# Patient Record
Sex: Male | Born: 1940 | Race: White | Hispanic: No | Marital: Single | Smoking: Never smoker
Health system: Southern US, Community
[De-identification: ages and names within clinical notes are randomized; demographics above are authoritative.]

## PROBLEM LIST (undated history)

## (undated) DIAGNOSIS — M1990 Unspecified osteoarthritis, unspecified site: Secondary | ICD-10-CM

## (undated) DIAGNOSIS — G4730 Sleep apnea, unspecified: Secondary | ICD-10-CM

## (undated) DIAGNOSIS — E119 Type 2 diabetes mellitus without complications: Secondary | ICD-10-CM

## (undated) DIAGNOSIS — I10 Essential (primary) hypertension: Secondary | ICD-10-CM

## (undated) DIAGNOSIS — Z973 Presence of spectacles and contact lenses: Secondary | ICD-10-CM

## (undated) DIAGNOSIS — Z972 Presence of dental prosthetic device (complete) (partial): Secondary | ICD-10-CM

## (undated) HISTORY — PX: PROSTATE SURGERY: SHX751

---

## 1999-06-15 ENCOUNTER — Encounter: Payer: Self-pay | Admitting: Orthopaedic Surgery

## 1999-06-15 ENCOUNTER — Encounter: Admission: RE | Admit: 1999-06-15 | Discharge: 1999-06-15 | Payer: Self-pay | Admitting: Orthopaedic Surgery

## 2005-10-13 ENCOUNTER — Ambulatory Visit (HOSPITAL_BASED_OUTPATIENT_CLINIC_OR_DEPARTMENT_OTHER): Admission: RE | Admit: 2005-10-13 | Discharge: 2005-10-13 | Payer: Self-pay | Admitting: *Deleted

## 2005-10-17 ENCOUNTER — Ambulatory Visit: Payer: Self-pay | Admitting: Internal Medicine

## 2005-11-10 ENCOUNTER — Ambulatory Visit (HOSPITAL_BASED_OUTPATIENT_CLINIC_OR_DEPARTMENT_OTHER): Admission: RE | Admit: 2005-11-10 | Discharge: 2005-11-10 | Payer: Self-pay | Admitting: *Deleted

## 2014-06-18 ENCOUNTER — Emergency Department (HOSPITAL_COMMUNITY): Payer: No Typology Code available for payment source

## 2014-06-18 ENCOUNTER — Emergency Department (HOSPITAL_COMMUNITY)
Admission: EM | Admit: 2014-06-18 | Discharge: 2014-06-19 | Disposition: A | Discharge status: Home / Self Care | Payer: No Typology Code available for payment source | Source: Home / Self Care | Attending: Emergency Medicine | Admitting: Emergency Medicine

## 2014-06-18 ENCOUNTER — Encounter (HOSPITAL_COMMUNITY): Payer: Self-pay | Admitting: Radiology

## 2014-06-18 DIAGNOSIS — S20312A Abrasion of left front wall of thorax, initial encounter: Secondary | ICD-10-CM | POA: Insufficient documentation

## 2014-06-18 DIAGNOSIS — Y92410 Unspecified street and highway as the place of occurrence of the external cause: Secondary | ICD-10-CM

## 2014-06-18 DIAGNOSIS — S60512A Abrasion of left hand, initial encounter: Secondary | ICD-10-CM

## 2014-06-18 DIAGNOSIS — S80812A Abrasion, left lower leg, initial encounter: Secondary | ICD-10-CM | POA: Insufficient documentation

## 2014-06-18 DIAGNOSIS — M25519 Pain in unspecified shoulder: Secondary | ICD-10-CM

## 2014-06-18 DIAGNOSIS — S60511A Abrasion of right hand, initial encounter: Secondary | ICD-10-CM

## 2014-06-18 DIAGNOSIS — S42309A Unspecified fracture of shaft of humerus, unspecified arm, initial encounter for closed fracture: Secondary | ICD-10-CM

## 2014-06-18 DIAGNOSIS — Z880 Allergy status to penicillin: Secondary | ICD-10-CM | POA: Insufficient documentation

## 2014-06-18 DIAGNOSIS — S42291A Other displaced fracture of upper end of right humerus, initial encounter for closed fracture: Secondary | ICD-10-CM | POA: Insufficient documentation

## 2014-06-18 DIAGNOSIS — Z79899 Other long term (current) drug therapy: Secondary | ICD-10-CM

## 2014-06-18 DIAGNOSIS — S80811A Abrasion, right lower leg, initial encounter: Secondary | ICD-10-CM

## 2014-06-18 DIAGNOSIS — Y998 Other external cause status: Secondary | ICD-10-CM | POA: Insufficient documentation

## 2014-06-18 DIAGNOSIS — Y9389 Activity, other specified: Secondary | ICD-10-CM

## 2014-06-18 DIAGNOSIS — R55 Syncope and collapse: Secondary | ICD-10-CM | POA: Diagnosis not present

## 2014-06-18 LAB — CBC WITH DIFFERENTIAL/PLATELET
Basophils Absolute: 0 10*3/uL (ref 0.0–0.1)
Basophils Relative: 0 % (ref 0–1)
EOS PCT: 0 % (ref 0–5)
Eosinophils Absolute: 0.1 10*3/uL (ref 0.0–0.7)
HCT: 36.2 % — ABNORMAL LOW (ref 39.0–52.0)
HEMOGLOBIN: 12.6 g/dL — AB (ref 13.0–17.0)
LYMPHS PCT: 6 % — AB (ref 12–46)
Lymphs Abs: 1.2 10*3/uL (ref 0.7–4.0)
MCH: 31.7 pg (ref 26.0–34.0)
MCHC: 34.8 g/dL (ref 30.0–36.0)
MCV: 91 fL (ref 78.0–100.0)
MONO ABS: 1.5 10*3/uL — AB (ref 0.1–1.0)
Monocytes Relative: 7 % (ref 3–12)
Neutro Abs: 19.1 10*3/uL — ABNORMAL HIGH (ref 1.7–7.7)
Neutrophils Relative %: 87 % — ABNORMAL HIGH (ref 43–77)
Platelets: 218 10*3/uL (ref 150–400)
RBC: 3.98 MIL/uL — ABNORMAL LOW (ref 4.22–5.81)
RDW: 12.1 % (ref 11.5–15.5)
WBC: 21.9 10*3/uL — ABNORMAL HIGH (ref 4.0–10.5)

## 2014-06-18 LAB — BASIC METABOLIC PANEL
Anion gap: 16 — ABNORMAL HIGH (ref 5–15)
BUN: 15 mg/dL (ref 6–23)
CALCIUM: 8.9 mg/dL (ref 8.4–10.5)
CO2: 21 mEq/L (ref 19–32)
Chloride: 101 mEq/L (ref 96–112)
Creatinine, Ser: 1.07 mg/dL (ref 0.50–1.35)
GFR calc Af Amer: 79 mL/min — ABNORMAL LOW (ref >90)
GFR calc non Af Amer: 68 mL/min — ABNORMAL LOW (ref >90)
Glucose, Bld: 155 mg/dL — ABNORMAL HIGH (ref 70–99)
Potassium: 4.1 mEq/L (ref 3.7–5.3)
Sodium: 138 mEq/L (ref 137–147)

## 2014-06-18 MED ORDER — HYDROMORPHONE HCL 1 MG/ML IJ SOLN
1.0000 mg | Freq: Once | INTRAMUSCULAR | Status: AC
  Administered 2014-06-18: 1 mg via INTRAVENOUS
  Filled 2014-06-18: qty 1

## 2014-06-18 NOTE — ED Notes (Signed)
Pt placed on monitor and into gown upon arrival to room. Pt monitored by blood pressure, pulse ox, and 12 lead.  

## 2014-06-18 NOTE — ED Notes (Signed)
Notified [employer]

## 2014-06-18 NOTE — ED Notes (Signed)
Pt remains monitored by blood pressure, pulse ox, and 5 lead. pts family remains at bedside.  

## 2014-06-18 NOTE — ED Notes (Signed)
Transported to radiology 

## 2014-06-18 NOTE — ED Notes (Signed)
Spoke with [employer] representative. No episodes were picked up by loop monitor.

## 2014-06-18 NOTE — ED Notes (Signed)
Pt remains monitored by blood pressure, pulse ox, and 12 lead. pts family remains at bedside.  
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
## 2014-06-18 NOTE — Progress Notes (Signed)
Orthopedic Tech Progress Note Patient Details:  Timothy Drake [birth year] [id number]  Ortho Devices Type of Ortho Device: Sling immobilizer Ortho Device/Splint Location: RUE Ortho Device/Splint Interventions: Ordered, Application   Braulio Bosch 06/18/2014, 10:19 PM

## 2014-06-18 NOTE — ED Notes (Signed)
Ortho tech paged  
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
## 2014-06-19 MED ORDER — HYDROCODONE-ACETAMINOPHEN 5-325 MG PO TABS
1.0000 | ORAL_TABLET | Freq: Once | ORAL | Status: AC
  Administered 2014-06-19: 1 via ORAL
  Filled 2014-06-19: qty 1

## 2014-06-19 MED ORDER — HYDROCODONE-ACETAMINOPHEN 5-325 MG PO TABS: 1.0000 | ORAL_TABLET | Freq: Four times a day (QID) | ORAL | Status: DC | PRN
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
## 2014-06-20 ENCOUNTER — Encounter (HOSPITAL_BASED_OUTPATIENT_CLINIC_OR_DEPARTMENT_OTHER): Payer: Self-pay | Admitting: *Deleted

## 2014-06-20 NOTE — Progress Notes (Signed)
Called dr Oren Binet nurse to get notes from ov tomorrow-and if pt needs telemetry post op

## 2014-06-21 ENCOUNTER — Inpatient Hospital Stay (HOSPITAL_COMMUNITY): Payer: No Typology Code available for payment source

## 2014-06-21 ENCOUNTER — Other Ambulatory Visit: Payer: Self-pay

## 2014-06-21 ENCOUNTER — Encounter (HOSPITAL_COMMUNITY): Payer: Self-pay | Admitting: Internal Medicine

## 2014-06-21 ENCOUNTER — Inpatient Hospital Stay (HOSPITAL_COMMUNITY)
Admission: AD | Admit: 2014-06-21 | Discharge: 2014-06-26 | DRG: 982 | Disposition: A | Discharge status: Home / Self Care | Payer: No Typology Code available for payment source | Source: Ambulatory Visit | Attending: Family Medicine | Admitting: Family Medicine

## 2014-06-21 DIAGNOSIS — F329 Major depressive disorder, single episode, unspecified: Secondary | ICD-10-CM | POA: Diagnosis present

## 2014-06-21 DIAGNOSIS — Z880 Allergy status to penicillin: Secondary | ICD-10-CM | POA: Diagnosis not present

## 2014-06-21 DIAGNOSIS — E119 Type 2 diabetes mellitus without complications: Secondary | ICD-10-CM

## 2014-06-21 DIAGNOSIS — G4733 Obstructive sleep apnea (adult) (pediatric): Secondary | ICD-10-CM | POA: Diagnosis present

## 2014-06-21 DIAGNOSIS — R55 Syncope and collapse: Secondary | ICD-10-CM | POA: Diagnosis present

## 2014-06-21 DIAGNOSIS — S4291XS Fracture of right shoulder girdle, part unspecified, sequela: Secondary | ICD-10-CM

## 2014-06-21 DIAGNOSIS — I10 Essential (primary) hypertension: Secondary | ICD-10-CM | POA: Diagnosis present

## 2014-06-21 DIAGNOSIS — Z9889 Other specified postprocedural states: Secondary | ICD-10-CM

## 2014-06-21 DIAGNOSIS — M1990 Unspecified osteoarthritis, unspecified site: Secondary | ICD-10-CM | POA: Diagnosis present

## 2014-06-21 DIAGNOSIS — Z8546 Personal history of malignant neoplasm of prostate: Secondary | ICD-10-CM | POA: Diagnosis not present

## 2014-06-21 DIAGNOSIS — E780 Pure hypercholesterolemia: Secondary | ICD-10-CM | POA: Diagnosis present

## 2014-06-21 DIAGNOSIS — S4291XA Fracture of right shoulder girdle, part unspecified, initial encounter for closed fracture: Secondary | ICD-10-CM | POA: Diagnosis present

## 2014-06-21 DIAGNOSIS — R569 Unspecified convulsions: Secondary | ICD-10-CM | POA: Insufficient documentation

## 2014-06-21 DIAGNOSIS — S42291A Other displaced fracture of upper end of right humerus, initial encounter for closed fracture: Secondary | ICD-10-CM | POA: Diagnosis present

## 2014-06-21 LAB — COMPREHENSIVE METABOLIC PANEL
ALT: 15 U/L (ref 0–53)
AST: 19 U/L (ref 0–37)
Albumin: 3 g/dL — ABNORMAL LOW (ref 3.5–5.2)
Alkaline Phosphatase: 57 U/L (ref 39–117)
Anion gap: 13 (ref 5–15)
BUN: 18 mg/dL (ref 6–23)
CALCIUM: 8.9 mg/dL (ref 8.4–10.5)
CO2: 25 mEq/L (ref 19–32)
Chloride: 99 mEq/L (ref 96–112)
Creatinine, Ser: 1.01 mg/dL (ref 0.50–1.35)
GFR calc non Af Amer: 73 mL/min — ABNORMAL LOW (ref >90)
GFR, EST AFRICAN AMERICAN: 84 mL/min — AB (ref >90)
Glucose, Bld: 145 mg/dL — ABNORMAL HIGH (ref 70–99)
POTASSIUM: 4.1 meq/L (ref 3.7–5.3)
SODIUM: 137 meq/L (ref 137–147)
TOTAL PROTEIN: 6.1 g/dL (ref 6.0–8.3)
Total Bilirubin: 0.4 mg/dL (ref 0.3–1.2)

## 2014-06-21 LAB — CBC WITH DIFFERENTIAL/PLATELET
BASOS PCT: 0 % (ref 0–1)
Basophils Absolute: 0 10*3/uL (ref 0.0–0.1)
EOS ABS: 0.2 10*3/uL (ref 0.0–0.7)
EOS PCT: 2 % (ref 0–5)
HEMATOCRIT: 29.4 % — AB (ref 39.0–52.0)
HEMOGLOBIN: 10 g/dL — AB (ref 13.0–17.0)
LYMPHS ABS: 1.4 10*3/uL (ref 0.7–4.0)
Lymphocytes Relative: 16 % (ref 12–46)
MCH: 30.9 pg (ref 26.0–34.0)
MCHC: 34 g/dL (ref 30.0–36.0)
MCV: 90.7 fL (ref 78.0–100.0)
MONO ABS: 0.8 10*3/uL (ref 0.1–1.0)
Monocytes Relative: 9 % (ref 3–12)
NEUTROS PCT: 73 % (ref 43–77)
Neutro Abs: 6.4 10*3/uL (ref 1.7–7.7)
Platelets: 205 10*3/uL (ref 150–400)
RBC: 3.24 MIL/uL — AB (ref 4.22–5.81)
RDW: 12.3 % (ref 11.5–15.5)
WBC: 8.9 10*3/uL (ref 4.0–10.5)

## 2014-06-21 LAB — TROPONIN I: Troponin I: <0.3 ng/mL (ref <0.30)

## 2014-06-21 LAB — TSH: TSH: 4.03 u[IU]/mL (ref 0.350–4.500)

## 2014-06-21 LAB — GLUCOSE, CAPILLARY
Glucose-Capillary: 148 mg/dL — ABNORMAL HIGH (ref 70–99)
Glucose-Capillary: 122 mg/dL — ABNORMAL HIGH (ref 70–99)

## 2014-06-21 MED ORDER — DOCUSATE SODIUM 100 MG PO CAPS
100.0000 mg | ORAL_CAPSULE | Freq: Two times a day (BID) | ORAL | Status: DC
  Administered 2014-06-21 – 2014-06-26 (×10): 100 mg via ORAL
  Filled 2014-06-21 (×10): qty 1

## 2014-06-21 MED ORDER — VENLAFAXINE HCL ER 150 MG PO TB24: 150.0000 mg | ORAL_TABLET | Freq: Every morning | ORAL | Status: DC

## 2014-06-21 MED ORDER — ZOLPIDEM TARTRATE 5 MG PO TABS
5.0000 mg | ORAL_TABLET | Freq: Every evening | ORAL | Status: DC | PRN
  Administered 2014-06-21 – 2014-06-25 (×5): 5 mg via ORAL
  Filled 2014-06-21 (×4): qty 1

## 2014-06-21 MED ORDER — SODIUM CHLORIDE 0.9 % IJ SOLN: 3.0000 mL | INTRAMUSCULAR | Status: DC | PRN

## 2014-06-21 MED ORDER — ENOXAPARIN SODIUM 40 MG/0.4ML ~~LOC~~ SOLN
40.0000 mg | SUBCUTANEOUS | Status: DC
  Administered 2014-06-21 – 2014-06-25 (×5): 40 mg via SUBCUTANEOUS
  Filled 2014-06-21 (×6): qty 0.4

## 2014-06-21 MED ORDER — SODIUM CHLORIDE 0.9 % IJ SOLN
3.0000 mL | Freq: Two times a day (BID) | INTRAMUSCULAR | Status: DC
  Administered 2014-06-22 – 2014-06-24 (×5): 3 mL via INTRAVENOUS

## 2014-06-21 MED ORDER — VENLAFAXINE HCL ER 150 MG PO CP24
150.0000 mg | ORAL_CAPSULE | Freq: Every day | ORAL | Status: DC
  Administered 2014-06-22 – 2014-06-26 (×4): 150 mg via ORAL
  Filled 2014-06-21 (×6): qty 1

## 2014-06-21 MED ORDER — SODIUM CHLORIDE 0.9 % IJ SOLN
3.0000 mL | Freq: Two times a day (BID) | INTRAMUSCULAR | Status: DC
  Administered 2014-06-21 – 2014-06-25 (×5): 3 mL via INTRAVENOUS

## 2014-06-21 MED ORDER — ALBUTEROL SULFATE (2.5 MG/3ML) 0.083% IN NEBU: 2.5000 mg | INHALATION_SOLUTION | RESPIRATORY_TRACT | Status: DC | PRN

## 2014-06-21 MED ORDER — PRAVASTATIN SODIUM 80 MG PO TABS
80.0000 mg | ORAL_TABLET | Freq: Every day | ORAL | Status: DC
  Administered 2014-06-21 – 2014-06-25 (×5): 80 mg via ORAL
  Filled 2014-06-21 (×6): qty 1

## 2014-06-21 MED ORDER — INSULIN ASPART 100 UNIT/ML ~~LOC~~ SOLN
0.0000 [IU] | Freq: Three times a day (TID) | SUBCUTANEOUS | Status: DC
  Administered 2014-06-21 – 2014-06-22 (×2): 2 [IU] via SUBCUTANEOUS
  Administered 2014-06-22 – 2014-06-23 (×3): 3 [IU] via SUBCUTANEOUS
  Administered 2014-06-23: 2 [IU] via SUBCUTANEOUS
  Administered 2014-06-24: 3 [IU] via SUBCUTANEOUS
  Administered 2014-06-24: 2 [IU] via SUBCUTANEOUS
  Administered 2014-06-25: 5 [IU] via SUBCUTANEOUS
  Administered 2014-06-26: 2 [IU] via SUBCUTANEOUS
  Administered 2014-06-26: 5 [IU] via SUBCUTANEOUS

## 2014-06-21 MED ORDER — ONDANSETRON HCL 4 MG PO TABS: 4.0000 mg | ORAL_TABLET | Freq: Four times a day (QID) | ORAL | Status: DC | PRN

## 2014-06-21 MED ORDER — HYDROCODONE-ACETAMINOPHEN 5-325 MG PO TABS
1.0000 | ORAL_TABLET | Freq: Four times a day (QID) | ORAL | Status: DC | PRN
Start: 2014-06-21 — End: 2014-06-26
  Administered 2014-06-21 – 2014-06-26 (×9): 1 via ORAL
  Filled 2014-06-21 (×9): qty 1

## 2014-06-21 MED ORDER — ACETAMINOPHEN 650 MG RE SUPP: 650.0000 mg | Freq: Four times a day (QID) | RECTAL | Status: DC | PRN

## 2014-06-21 MED ORDER — AMLODIPINE BESYLATE 5 MG PO TABS
5.0000 mg | ORAL_TABLET | Freq: Every day | ORAL | Status: DC
  Administered 2014-06-22 – 2014-06-26 (×4): 5 mg via ORAL
  Filled 2014-06-21 (×5): qty 1

## 2014-06-21 MED ORDER — IRBESARTAN 75 MG PO TABS
75.0000 mg | ORAL_TABLET | Freq: Every day | ORAL | Status: DC
  Administered 2014-06-22 – 2014-06-26 (×4): 75 mg via ORAL
  Filled 2014-06-21 (×5): qty 1

## 2014-06-21 MED ORDER — ACETAMINOPHEN 325 MG PO TABS
650.0000 mg | ORAL_TABLET | Freq: Four times a day (QID) | ORAL | Status: DC | PRN
Start: 2014-06-21 — End: 2014-06-26
  Administered 2014-06-22: 650 mg via ORAL
  Filled 2014-06-21 (×2): qty 2

## 2014-06-21 MED ORDER — MORPHINE SULFATE 2 MG/ML IJ SOLN
1.0000 mg | INTRAMUSCULAR | Status: DC | PRN
  Administered 2014-06-22 – 2014-06-26 (×4): 1 mg via INTRAVENOUS
  Filled 2014-06-21 (×4): qty 1

## 2014-06-21 MED ORDER — ONDANSETRON HCL 4 MG/2ML IJ SOLN
4.0000 mg | Freq: Four times a day (QID) | INTRAMUSCULAR | Status: DC | PRN
  Administered 2014-06-22: 4 mg via INTRAVENOUS
  Filled 2014-06-21: qty 2

## 2014-06-21 MED ORDER — SODIUM CHLORIDE 0.9 % IV SOLN
250.0000 mL | INTRAVENOUS | Status: DC | PRN
Start: 2014-06-21 — End: 2014-06-26
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
## 2014-06-21 NOTE — Progress Notes (Signed)
Patient has home CPAP machine at bedside.  Patient is familiar with equipment and procedure and will self-administer CPAP at bedtime.  Patient was encouraged to notify RN and Respiratory with any issues or concerns.  Humidifier filled.
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
## 2014-06-22 DIAGNOSIS — I369 Nonrheumatic tricuspid valve disorder, unspecified: Secondary | ICD-10-CM

## 2014-06-22 DIAGNOSIS — S4291XS Fracture of right shoulder girdle, part unspecified, sequela: Secondary | ICD-10-CM

## 2014-06-22 DIAGNOSIS — R55 Syncope and collapse: Secondary | ICD-10-CM

## 2014-06-22 LAB — COMPREHENSIVE METABOLIC PANEL
ALBUMIN: 3 g/dL — AB (ref 3.5–5.2)
ALT: 15 U/L (ref 0–53)
AST: 17 U/L (ref 0–37)
Alkaline Phosphatase: 60 U/L (ref 39–117)
Anion gap: 14 (ref 5–15)
BILIRUBIN TOTAL: 0.5 mg/dL (ref 0.3–1.2)
BUN: 18 mg/dL (ref 6–23)
CHLORIDE: 100 meq/L (ref 96–112)
CO2: 25 mEq/L (ref 19–32)
CREATININE: 0.93 mg/dL (ref 0.50–1.35)
Calcium: 8.8 mg/dL (ref 8.4–10.5)
GFR calc non Af Amer: 83 mL/min — ABNORMAL LOW (ref >90)
Glucose, Bld: 136 mg/dL — ABNORMAL HIGH (ref 70–99)
Potassium: 4 mEq/L (ref 3.7–5.3)
Sodium: 139 mEq/L (ref 137–147)
TOTAL PROTEIN: 6.1 g/dL (ref 6.0–8.3)

## 2014-06-22 LAB — GLUCOSE, CAPILLARY
GLUCOSE-CAPILLARY: 145 mg/dL — AB (ref 70–99)
GLUCOSE-CAPILLARY: 153 mg/dL — AB (ref 70–99)
Glucose-Capillary: 167 mg/dL — ABNORMAL HIGH (ref 70–99)

## 2014-06-22 LAB — CBC
HEMATOCRIT: 31.7 % — AB (ref 39.0–52.0)
Hemoglobin: 11 g/dL — ABNORMAL LOW (ref 13.0–17.0)
MCH: 32.2 pg (ref 26.0–34.0)
MCHC: 34.7 g/dL (ref 30.0–36.0)
MCV: 92.7 fL (ref 78.0–100.0)
Platelets: 197 10*3/uL (ref 150–400)
RBC: 3.42 MIL/uL — ABNORMAL LOW (ref 4.22–5.81)
RDW: 12.5 % (ref 11.5–15.5)
WBC: 8.4 10*3/uL (ref 4.0–10.5)

## 2014-06-22 LAB — HEMOGLOBIN A1C
Hgb A1c MFr Bld: 6.9 % — ABNORMAL HIGH (ref <5.7)
MEAN PLASMA GLUCOSE: 151 mg/dL — AB (ref <117)

## 2014-06-22 NOTE — Progress Notes (Signed)
  Echocardiogram 2D Echocardiogram has been performed.  Timothy Drake 06/22/2014, 9:39 AM

## 2014-06-22 NOTE — Progress Notes (Signed)
VASCULAR LAB PRELIMINARY  PRELIMINARY  PRELIMINARY  PRELIMINARY  Carotid Dopplers completed.    Preliminary report:  1-39% ICA stenosis.  Right vertebral artery flow is antegrade.  Left vertebral artery flow is abnormal.   Halcyon Heck, RVT 06/22/2014, 4:42 PM

## 2014-06-22 NOTE — Evaluation (Signed)
Physical Therapy Evaluation Patient Details Name: Timothy Drake MRN: [id number] DOB: July 06, 1941 Today's Date: 06/22/2014   History of Present Illness  73 y.o. male with a past medical history of hypertension, type 2 diabetes, hypercholesterolemia, who has had the syncopal episodes on and off for the last 3 years. He's had about 8-9 episodes in total.  Pt adm after fall resulting in Rt humeral head fx. planned for Rt TSA on 06/25/14.  Clinical Impression  Pt adm due to above. Presents with decreased independence with functional mobility secondary to deficits indicated below (see PT problem list). Pt with multiple syncopal episodes. Pt does not have caregiver support at home and will benefit from SNF for post acute rehab (pt plans [facility] [place]). Pt to benefit from skilled acute PT in acute care to maximize functional PT until D/C.     Follow Up Recommendations SNF    Equipment Recommendations  Cane    Recommendations for Other Services OT consult     Precautions / Restrictions Precautions Precautions: Shoulder;Fall Type of Shoulder Precautions: sling for comfort till surgery Shoulder Interventions: For comfort Required Braces or Orthoses: Sling Restrictions Weight Bearing Restrictions:  (not clarified; assumed NWB at this time)      Mobility  Bed Mobility Overal bed mobility: Needs Assistance Bed Mobility: Rolling;Sidelying to Sit;Sit to Sidelying Rolling: Supervision Sidelying to sit: Mod assist;HOB elevated     Sit to sidelying: Min assist General bed mobility comments: cues for log rolling technique; (A) to perform due to NWB status and pain in Rt UE  Transfers Overall transfer level: Needs assistance Equipment used: 1 person hand held assist Transfers: Sit to/from Stand Sit to Stand: Min guard         General transfer comment: min guard to steady  Ambulation/Gait Ambulation/Gait assistance: Min guard Ambulation Distance (Feet): 100  Feet Assistive device: 1 person hand held assist;None (support through gt belt PRN) Gait Pattern/deviations: Step-through pattern;Decreased stride length Gait velocity: decreased Gait velocity interpretation: Below normal speed for age/gender General Gait Details: challenged with high level balance activities; pt slighty unsteady; Handheld(A) or support of body weight through gt belt as needed  Stairs            Wheelchair Mobility    Modified Rankin (Stroke Patients Only)       Balance Overall balance assessment: Needs assistance;History of Falls Sitting-balance support: Feet supported;No upper extremity supported Sitting balance-Leahy Scale: Good Sitting balance - Comments: denied any dizziness   Standing balance support: During functional activity;Single extremity supported;No upper extremity supported Standing balance-Leahy Scale: Fair Standing balance comment: stood short amount of time without UE support; slight sway             High level balance activites: Head turns;Sudden stops;Direction changes;Backward walking High Level Balance Comments: slighty unsteady             Pertinent Vitals/Pain Pain Assessment: 0-10 Pain Score: 6  Pain Location: Rt shoulder Pain Descriptors / Indicators:  ("sickening") Pain Intervention(s): Monitored during session;Patient requesting pain meds-RN notified;Repositioned    Home Living Family/patient expects to be discharged to:: Skilled nursing facility Living Arrangements: Other relatives             Home Equipment: None Additional Comments: 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; no family to support him at home; plans [facility]    Prior Function Level of Independence: Independent  Hand Dominance   Dominant Hand: Right    Extremity/Trunk Assessment   Upper Extremity Assessment: Defer to OT evaluation (Rt UE in sling)           Lower Extremity Assessment: Overall WFL  for tasks assessed      Cervical / Trunk Assessment: Normal  Communication   Communication: No difficulties  Cognition Arousal/Alertness: Awake/alert Behavior During Therapy: WFL for tasks assessed/performed Overall Cognitive Status: Within Functional Limits for tasks assessed                      General Comments      Exercises        Assessment/Plan    PT Assessment Patient needs continued PT services  PT Diagnosis Abnormality of gait;Acute pain   PT Problem List Decreased activity tolerance;Decreased balance;Decreased mobility;Decreased knowledge of use of DME;Decreased safety awareness;Pain  PT Treatment Interventions DME instruction;Gait training;Stair training;Functional mobility training;Therapeutic activities;Therapeutic exercise;Balance training;Neuromuscular re-education;Patient/family education   PT Goals (Current goals can be found in the Care Plan section) Acute Rehab PT Goals Patient Stated Goal: to go to rehab then get back to independent lifestyle PT Goal Formulation: With patient Time For Goal Achievement: 06/29/14 Potential to Achieve Goals: Good    Frequency Min 4X/week   Barriers to discharge Decreased caregiver support lives with aunt, he is her caregiver    Co-evaluation               End of Session Equipment Utilized During Treatment: Gait belt;Other (comment) (sling) Activity Tolerance: Patient tolerated treatment well Patient left: in bed;with call bell/phone within reach Nurse Communication: Patient requests pain meds;Mobility status         Time: 1440-1500 PT Time Calculation (min) (ACUTE ONLY): 20 min   Charges:   PT Evaluation $Initial PT Evaluation Tier I: 1 Procedure PT Treatments $Gait Training: 8-22 mins   PT G CodesGustavus Bryant, [place]  [phone] 06/22/2014, 3:35 PM

## 2014-06-22 NOTE — Plan of Care (Signed)
Problem: Consults Goal: Diagnosis - Shoulder Surgery Outcome: Completed/Met Date Met:  06/22/14 Total Shoulder Arthroplasty

## 2014-06-22 NOTE — Progress Notes (Signed)
[facility] PROGRESS NOTE  Timothy Drake [id number]:[id number] DOB: [birth year] DOA: 06/21/2014 PCP: Angelina Sheriff., MD  Assessment/Plan: Principal Problem:   Syncope - Etiology uncertain - Pt on telemetry monitoring: no red flags reported - Pt was evaluated by his cardiologist according to EMR prior to admission. - Will assess orthostatic vital signs - Echocardiogram results pending - obtain carotid dopplers - Pt should consider sleep study if not already done and if all other work up negative.  Active Problems:   Shoulder fracture, right - Ortho on board and patient will most likely transition to a SNF on discharge.    DM type 2 (diabetes mellitus, type 2) - Pt on carb modified diet - SSI    Essential hypertension - stable on amlodipine    OSA (obstructive sleep apnea) - Pt to continue home cpap regimen or supplemental oxygen via [place] at night.  Code Status: full Family Communication: no family at bedside  Disposition Plan: Discharge    Consultants:  Ortho  Procedures:  none  Antibiotics:  None  HPI/Subjective: Pt has no new complaints. No acute issues overnight.  Objective: Filed Vitals:   06/22/14 0556  BP: 147/66  Pulse: 56  Temp: 98.5 F (36.9 C)  Resp: 16    Intake/Output Summary (Last 24 hours) at 06/22/14 1052 Last data filed at 06/22/14 0459  Gross per 24 hour  Intake    300 ml  Output    450 ml  Net   -150 ml   Filed Weights   06/21/14 1415  Weight: 83.915 kg (185 lb)    Exam:   General:  Pt in nad, alert and awake  Cardiovascular: rrr, no mrg  Respiratory: cta bl, no wheezes  Abdomen: soft, NT, ND  Musculoskeletal:  right arm in sling   Data Reviewed: Basic Metabolic Panel:  Recent Labs Lab 06/18/14 2003 06/21/14 1642 06/22/14 0738  NA 138 137 139  K 4.1 4.1 4.0  CL 101 99 100  CO2 21 25 25   GLUCOSE 155* 145* 136*  BUN 15 18 18   CREATININE 1.07 1.01 0.93  CALCIUM 8.9 8.9 8.8   Liver Function  Tests:  Recent Labs Lab 06/21/14 1642 06/22/14 0738  AST 19 17  ALT 15 15  ALKPHOS 57 60  BILITOT 0.4 0.5  PROT 6.1 6.1  ALBUMIN 3.0* 3.0*   No results for input(s): LIPASE, AMYLASE in the last 168 hours. No results for input(s): AMMONIA in the last 168 hours. CBC:  Recent Labs Lab 06/18/14 2003 06/21/14 1642 06/22/14 0738  WBC 21.9* 8.9 PENDING  NEUTROABS 19.1* 6.4  --   HGB 12.6* 10.0* 11.0*  HCT 36.2* 29.4* 31.7*  MCV 91.0 90.7 92.7  PLT 218 205 PENDING   Cardiac Enzymes:  Recent Labs Lab 06/21/14 1642  TROPONINI <0.30   BNP (last 3 results) No results for input(s): PROBNP in the last 8760 hours. CBG:  Recent Labs Lab 06/21/14 1616 06/21/14 2113 06/22/14 0645  GLUCAP 148* 122* 145*    No results found for this or any previous visit (from the past 240 hour(s)).   Studies: Dg Chest 1 View  06/22/2014   CLINICAL DATA:  Syncope  EXAM: CHEST - 1 VIEW  COMPARISON:  CT chest dated 06/20/2014  FINDINGS: Lungs are essentially clear. No focal consolidation. No pleural effusion or pneumothorax.  Cardiomegaly.  Holter monitor.  IMPRESSION: No evidence of acute cardiopulmonary disease.   Electronically Signed   By: Julian Hy M.D.   On: 06/22/2014  02:59    Scheduled Meds: . amLODipine  5 mg Oral Daily  . docusate sodium  100 mg Oral BID  . enoxaparin (LOVENOX) injection  40 mg Subcutaneous Q24H  . insulin aspart  0-15 Units Subcutaneous TID WC  . irbesartan  75 mg Oral Daily  . pravastatin  80 mg Oral QHS  . sodium chloride  3 mL Intravenous Q12H  . sodium chloride  3 mL Intravenous Q12H  . venlafaxine XR  150 mg Oral Q breakfast   Continuous Infusions:    Time spent: > 35 minutes    [name]  [facility] Pager [phone] If 7PM-7AM, please contact night-coverage at [email or web address], password [facility] 06/22/2014, 10:52 AM  LOS: 1 day

## 2014-06-23 LAB — GLUCOSE, CAPILLARY
GLUCOSE-CAPILLARY: 131 mg/dL — AB (ref 70–99)
GLUCOSE-CAPILLARY: 175 mg/dL — AB (ref 70–99)
Glucose-Capillary: 130 mg/dL — ABNORMAL HIGH (ref 70–99)
Glucose-Capillary: 144 mg/dL — ABNORMAL HIGH (ref 70–99)

## 2014-06-23 NOTE — Progress Notes (Signed)
[facility] PROGRESS NOTE  Timothy Drake SEG:[id number] DOB: 1940-11-30 DOA: 06/21/2014 PCP: Angelina Sheriff., MD  Assessment/Plan: Principal Problem:   Syncope - Etiology uncertain, although preliminary results show an abnormal Left vertebral artery flow and orthostatic vital signs going from laying to sitting were positive.  - Pt on telemetry monitoring: no red flags reported - Pt was evaluated by his cardiologist according to EMR prior to admission. - Continue to assess orthostatic vital signs - Echocardiogram results pending - Carotid dopplers pending - Pt should consider sleep study if not already done and if all other work up negative.  Active Problems:   Shoulder fracture, right - Ortho on board and patient will most likely transition to a SNF on discharge.    DM type 2 (diabetes mellitus, type 2) - Pt on carb modified diet - SSI    Essential hypertension - stable on amlodipine    OSA (obstructive sleep apnea) - Pt to continue home cpap regimen or supplemental oxygen via [place] at night.  Code Status: full Family Communication: no family at bedside  Disposition Plan: Pending further work up   Consultants:  Ortho  Procedures:  none  Antibiotics:  None  HPI/Subjective: Pt has no new complaints. No acute issues overnight.  Objective: Filed Vitals:   06/23/14 0615  BP: 138/68  Pulse: 71  Temp: 98.3 F (36.8 C)  Resp: 16    Intake/Output Summary (Last 24 hours) at 06/23/14 1111 Last data filed at 06/22/14 1600  Gross per 24 hour  Intake    480 ml  Output      0 ml  Net    480 ml   Filed Weights   06/21/14 1415  Weight: 83.915 kg (185 lb)    Exam:   General:  Pt in nad, alert and awake  Cardiovascular: rrr, no mrg  Respiratory: cta bl, no wheezes  Abdomen: soft, NT, ND  Musculoskeletal:  right arm in sling   Data Reviewed: Basic Metabolic Panel:  Recent Labs Lab 06/18/14 2003 06/21/14 1642 06/22/14 0738  NA 138 137 139   K 4.1 4.1 4.0  CL 101 99 100  CO2 21 25 25   GLUCOSE 155* 145* 136*  BUN 15 18 18   CREATININE 1.07 1.01 0.93  CALCIUM 8.9 8.9 8.8   Liver Function Tests:  Recent Labs Lab 06/21/14 1642 06/22/14 0738  AST 19 17  ALT 15 15  ALKPHOS 57 60  BILITOT 0.4 0.5  PROT 6.1 6.1  ALBUMIN 3.0* 3.0*   No results for input(s): LIPASE, AMYLASE in the last 168 hours. No results for input(s): AMMONIA in the last 168 hours. CBC:  Recent Labs Lab 06/18/14 2003 06/21/14 1642 06/22/14 0738  WBC 21.9* 8.9 8.4  NEUTROABS 19.1* 6.4  --   HGB 12.6* 10.0* 11.0*  HCT 36.2* 29.4* 31.7*  MCV 91.0 90.7 92.7  PLT 218 205 197   Cardiac Enzymes:  Recent Labs Lab 06/21/14 1642  TROPONINI <0.30   BNP (last 3 results) No results for input(s): PROBNP in the last 8760 hours. CBG:  Recent Labs Lab 06/21/14 2113 06/22/14 0645 06/22/14 1129 06/22/14 1632 06/23/14 0713  GLUCAP 122* 145* 153* 167* 130*    No results found for this or any previous visit (from the past 240 hour(s)).   Studies: Dg Chest 1 View  06/22/2014   CLINICAL DATA:  Syncope  EXAM: CHEST - 1 VIEW  COMPARISON:  CT chest dated 06/20/2014  FINDINGS: Lungs are essentially clear. No focal  consolidation. No pleural effusion or pneumothorax.  Cardiomegaly.  Holter monitor.  IMPRESSION: No evidence of acute cardiopulmonary disease.   Electronically Signed   By: Julian Hy M.D.   On: 06/22/2014 02:59    Scheduled Meds: . amLODipine  5 mg Oral Daily  . docusate sodium  100 mg Oral BID  . enoxaparin (LOVENOX) injection  40 mg Subcutaneous Q24H  . insulin aspart  0-15 Units Subcutaneous TID WC  . irbesartan  75 mg Oral Daily  . pravastatin  80 mg Oral QHS  . sodium chloride  3 mL Intravenous Q12H  . sodium chloride  3 mL Intravenous Q12H  . venlafaxine XR  150 mg Oral Q breakfast   Continuous Infusions:    Time spent: > 35 minutes    [name]  [facility] Pager [phone] If 7PM-7AM, please contact  night-coverage at [email or web address], password [facility] 06/23/2014, 11:11 AM  LOS: 2 days

## 2014-06-24 ENCOUNTER — Inpatient Hospital Stay (HOSPITAL_COMMUNITY): Payer: No Typology Code available for payment source

## 2014-06-24 LAB — GLUCOSE, CAPILLARY
GLUCOSE-CAPILLARY: 132 mg/dL — AB (ref 70–99)
GLUCOSE-CAPILLARY: 175 mg/dL — AB (ref 70–99)
Glucose-Capillary: 167 mg/dL — ABNORMAL HIGH (ref 70–99)
Glucose-Capillary: 150 mg/dL — ABNORMAL HIGH (ref 70–99)

## 2014-06-24 MED ORDER — VANCOMYCIN HCL IN DEXTROSE 1-5 GM/200ML-% IV SOLN
1000.0000 mg | INTRAVENOUS | Status: AC
  Administered 2014-06-25: 1000 mg via INTRAVENOUS
  Filled 2014-06-24: qty 200

## 2014-06-24 NOTE — Progress Notes (Signed)
EEG Completed; Results Pending  

## 2014-06-24 NOTE — Progress Notes (Signed)
[facility] PROGRESS NOTE  WIRT HEMMERICH BOF:[id number] DOB: [birth year] DOA: 06/21/2014 PCP: Angelina Sheriff., MD  Assessment/Plan: Principal Problem:   Syncope - Etiology uncertain, although preliminary results show an abnormal Left vertebral artery flow and orthostatic vital signs going from laying to sitting were positive on last check. None other recorded despite order. Will reorder  - Pt on telemetry monitoring: no red flags reported - Pt was evaluated by his cardiologist according to EMR prior to admission. - Continue to assess orthostatic vital signs - Echocardiogram results pending - Carotid dopplers pending but preliminary results discussed above. Awaiting official report. - Pt should consider sleep study if not already done and if all other work up negative. - Obtain neuro consult and place order for EEG  Active Problems:   Shoulder fracture, right - Ortho on board and patient will most likely transition to a SNF on discharge. - Procedure most likely on 06/25/14    DM type 2 (diabetes mellitus, type 2) - Pt on carb modified diet - SSI    Essential hypertension - stable on amlodipine    OSA (obstructive sleep apnea) - Pt to continue home cpap regimen or supplemental oxygen via [place] at night.  Code Status: full Family Communication: no family at bedside  Disposition Plan: Pending further work up   Consultants:  Ortho  Procedures:  none  Antibiotics:  None  HPI/Subjective: Pt has no new complaints. States he is "depressed" about not knowing why he is passing out  Objective: Filed Vitals:   06/24/14 0615  BP: 143/55  Pulse: 70  Temp: 97.6 F (36.4 C)  Resp: 18    Intake/Output Summary (Last 24 hours) at 06/24/14 1100 Last data filed at 06/24/14 0800  Gross per 24 hour  Intake    240 ml  Output      0 ml  Net    240 ml   Filed Weights   06/21/14 1415  Weight: 83.915 kg (185 lb)    Exam:   General:  Pt in nad, alert and  awake  Cardiovascular: rrr, no mrg  Respiratory: cta bl, no wheezes  Abdomen: soft, NT, ND  Musculoskeletal:  right arm in sling   Data Reviewed: Basic Metabolic Panel:  Recent Labs Lab 06/18/14 2003 06/21/14 1642 06/22/14 0738  NA 138 137 139  K 4.1 4.1 4.0  CL 101 99 100  CO2 21 25 25   GLUCOSE 155* 145* 136*  BUN 15 18 18   CREATININE 1.07 1.01 0.93  CALCIUM 8.9 8.9 8.8   Liver Function Tests:  Recent Labs Lab 06/21/14 1642 06/22/14 0738  AST 19 17  ALT 15 15  ALKPHOS 57 60  BILITOT 0.4 0.5  PROT 6.1 6.1  ALBUMIN 3.0* 3.0*   No results for input(s): LIPASE, AMYLASE in the last 168 hours. No results for input(s): AMMONIA in the last 168 hours. CBC:  Recent Labs Lab 06/18/14 2003 06/21/14 1642 06/22/14 0738  WBC 21.9* 8.9 8.4  NEUTROABS 19.1* 6.4  --   HGB 12.6* 10.0* 11.0*  HCT 36.2* 29.4* 31.7*  MCV 91.0 90.7 92.7  PLT 218 205 197   Cardiac Enzymes:  Recent Labs Lab 06/21/14 1642  TROPONINI <0.30   BNP (last 3 results) No results for input(s): PROBNP in the last 8760 hours. CBG:  Recent Labs Lab 06/23/14 0713 06/23/14 1234 06/23/14 1637 06/23/14 2140 06/24/14 0621  GLUCAP 130* 131* 175* 144* 132*    No results found for this or any previous visit (  from the past 240 hour(s)).   Studies: No results found.  Scheduled Meds: . amLODipine  5 mg Oral Daily  . docusate sodium  100 mg Oral BID  . enoxaparin (LOVENOX) injection  40 mg Subcutaneous Q24H  . insulin aspart  0-15 Units Subcutaneous TID WC  . irbesartan  75 mg Oral Daily  . pravastatin  80 mg Oral QHS  . sodium chloride  3 mL Intravenous Q12H  . sodium chloride  3 mL Intravenous Q12H  . venlafaxine XR  150 mg Oral Q breakfast   Continuous Infusions:    Time spent: > 35 minutes    [name]  [facility] Pager [phone] If 7PM-7AM, please contact night-coverage at [email or web address], password [facility] 06/24/2014, 11:00 AM  LOS: 3 days

## 2014-06-24 NOTE — Progress Notes (Signed)
Physical Therapy Treatment Patient Details Name: Timothy Drake MRN: [id number] DOB: Dec 01, 1940 Today's Date: 06/24/2014    History of Present Illness 73 y.o. male with a past medical history of hypertension, type 2 diabetes, hypercholesterolemia, who has had the syncopal episodes on and off for the last 3 years. He's had about 8-9 episodes in total.  Pt adm after fall resulting in Rt humeral head fx. planned for Rt hemiarthroplasty on 06/25/14.    PT Comments    Making progress with mobility and activity tolerance; Orthostatic vitals wer inconclusive, though did not HR increase of 30 bpm with the transition to standing, pt asymptomatic  See other note of this date for detailed Orthostatic Vitals   Follow Up Recommendations  SNF     Equipment Recommendations  Cane    Recommendations for Other Services       Precautions / Restrictions Precautions Precautions: Shoulder;Fall Type of Shoulder Precautions: Dr [name] Shoulder Interventions: Shoulder sling/immobilizer;At all times;Off for dressing/bathing/exercises Precaution Booklet Issued: Yes (comment) Required Braces or Orthoses: Sling Restrictions Weight Bearing Restrictions: Yes RUE Weight Bearing: Non weight bearing    Mobility  Bed Mobility Overal bed mobility: Needs Assistance Bed Mobility: Rolling;Sidelying to Sit Rolling: Supervision Sidelying to sit: Mod assist;HOB elevated       General bed mobility comments: cues for log rolling technique; (A) to perform due to NWB status and pain in Rt UE  Transfers Overall transfer level: Needs assistance Equipment used: None Transfers: Sit to/from Stand Sit to Stand: Supervision         General transfer comment: Stood well without issues  Ambulation/Gait Ambulation/Gait assistance: Supervision Ambulation Distance (Feet): 120 Feet Assistive device: None Gait Pattern/deviations: Step-through pattern Gait velocity: decreased   General Gait Details:  Slow gait with 1-2 small losses of balance from whish pt recovered without the need for physical assist   Stairs            Wheelchair Mobility    Modified Rankin (Stroke Patients Only)       Balance                                    Cognition Arousal/Alertness: Awake/alert Behavior During Therapy: WFL for tasks assessed/performed Overall Cognitive Status: Within Functional Limits for tasks assessed                      Exercises Donning/doffing shirt without moving shoulder: Maximal assistance Method for sponge bathing under operated UE: Maximal assistance Donning/doffing sling/immobilizer: Maximal assistance Correct positioning of sling/immobilizer: Maximal assistance Sling wearing schedule (on at all times/off for ADL's): Supervision/safety Positioning of UE while sleeping: Minimal assistance    General Comments        Pertinent Vitals/Pain Pain Assessment: 0-10 Pain Score: 4  Pain Location: R shoulder Pain Descriptors / Indicators: Aching Pain Intervention(s): Limited activity within patient's tolerance;Monitored during session;Repositioned    Home Living Family/patient expects to be discharged to:: Skilled nursing facility                    Prior Function            PT Goals (current goals can now be found in the care plan section) Acute Rehab PT Goals Patient Stated Goal: to go to rehab then get back to independent lifestyle PT Goal Formulation: With patient Time For Goal Achievement: 06/29/14 Potential to Achieve Goals: Good  Progress towards PT goals: Progressing toward goals    Frequency  Min 4X/week    PT Plan Current plan remains appropriate    Co-evaluation             End of Session Equipment Utilized During Treatment:  (sling) Activity Tolerance: Patient tolerated treatment well Patient left: in chair;with call bell/phone within reach     Time: 1451-1520 PT Time Calculation (min) (ACUTE  ONLY): 29 min  Charges:  $Gait Training: 8-22 mins $Therapeutic Activity: 8-22 mins                    G Codes:      Quin Hoop 06/24/2014, 5:00 PM  Roney [place], [place] Pager [phone] Office [phone]
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
## 2014-06-24 NOTE — Progress Notes (Signed)
Utilization review completed.  
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
## 2014-06-24 NOTE — Progress Notes (Signed)
Physical Therapy Treatment Note  Seen for PT session;  Orthostatic vitals as follows:    06/24/14 1453 06/24/14 1500 06/24/14 1508  Vital Signs  Pulse Rate --  --  --   BP --  --  --   Patient Position (if appropriate) --  --  --   Orthostatic Lying   BP- Lying 138/58 mmHg --  --   Pulse- Lying 91 --  --   Orthostatic Sitting  BP- Sitting --  130/50 mmHg --   Pulse- Sitting --  90 --   Orthostatic Standing at 0 minutes  BP- Standing at 0 minutes --  --  128/83 mmHg  Pulse- Standing at 0 minutes --  --  123     06/24/14 1520  Vital Signs  Pulse Rate (!) 112  BP 132/70 mmHg  Patient Position (if appropriate) Standing (after 10 minute walk)  Orthostatic Lying   BP- Lying --   Pulse- Lying --   Orthostatic Sitting  BP- Sitting --   Pulse- Sitting --   Orthostatic Standing at 0 minutes  BP- Standing at 0 minutes --   Pulse- Standing at 0 minutes --    Pt asymptomatic for dizziness entire session;  Full note to follow;   Roney Marion, [place] Pager [phone] Office [phone]

## 2014-06-24 NOTE — Progress Notes (Signed)
Upon entering room, pt already on home CPAP. Pt resting well at this time.

## 2014-06-24 NOTE — Procedures (Signed)
EEG report.  Brief clinical history:  73 y.o. male with a past medical history of hypertension, type 2 diabetes, hypercholesterolemia, who has had the syncopal episodes on and off for the last 3 years. Concern for seizures.  Technique: this is a 17 channel routine scalp EEG performed at the bedside with bipolar and monopolar montages arranged in accordance to the international 10/20 system of electrode placement. One channel was dedicated to EKG recording.  No wakefulness or stage 2 was recorded during this study. Intermittent photic stimulation was utilized as activating procedure.  Description: patient falls into drowsiness as the study begins and remains in the drowsy state throughout the entire recording which precludes a reliable assessment of the best background.  No focal or generalized epileptiform discharges noted.  Intermittent photic stimulation did induce a normal driving response.  EKG showed sinus rhythm.  Impression: this is a normal EEG performed during the drowsy state. Please, be aware that a normal EEG does not exclude the possibility of epilepsy.  Clinical correlation is advised.   Dorian Pod, MD

## 2014-06-25 ENCOUNTER — Inpatient Hospital Stay (HOSPITAL_COMMUNITY): Payer: No Typology Code available for payment source

## 2014-06-25 ENCOUNTER — Encounter (HOSPITAL_COMMUNITY): Payer: Self-pay | Admitting: Anesthesiology

## 2014-06-25 ENCOUNTER — Inpatient Hospital Stay (HOSPITAL_COMMUNITY): Payer: No Typology Code available for payment source | Admitting: Anesthesiology

## 2014-06-25 ENCOUNTER — Encounter (HOSPITAL_COMMUNITY)
Admission: AD | Disposition: A | Discharge status: Home / Self Care | Payer: Self-pay | Source: Ambulatory Visit | Attending: Family Medicine

## 2014-06-25 ENCOUNTER — Ambulatory Visit (HOSPITAL_COMMUNITY): Admission: RE | Admit: 2014-06-25 | Payer: Medicare Other | Source: Ambulatory Visit | Admitting: Orthopedic Surgery

## 2014-06-25 DIAGNOSIS — Z8546 Personal history of malignant neoplasm of prostate: Secondary | ICD-10-CM

## 2014-06-25 DIAGNOSIS — R569 Unspecified convulsions: Secondary | ICD-10-CM

## 2014-06-25 HISTORY — DX: Essential (primary) hypertension: I10

## 2014-06-25 HISTORY — PX: SHOULDER HEMI-ARTHROPLASTY: SHX5049

## 2014-06-25 HISTORY — DX: Type 2 diabetes mellitus without complications: E11.9

## 2014-06-25 HISTORY — DX: Presence of dental prosthetic device (complete) (partial): Z97.2

## 2014-06-25 HISTORY — DX: Unspecified osteoarthritis, unspecified site: M19.90

## 2014-06-25 HISTORY — DX: Presence of spectacles and contact lenses: Z97.3

## 2014-06-25 HISTORY — DX: Sleep apnea, unspecified: G47.30

## 2014-06-25 LAB — GLUCOSE, CAPILLARY
GLUCOSE-CAPILLARY: 153 mg/dL — AB (ref 70–99)
GLUCOSE-CAPILLARY: 236 mg/dL — AB (ref 70–99)
GLUCOSE-CAPILLARY: 221 mg/dL — AB (ref 70–99)
Glucose-Capillary: 147 mg/dL — ABNORMAL HIGH (ref 70–99)
Glucose-Capillary: 200 mg/dL — ABNORMAL HIGH (ref 70–99)
Glucose-Capillary: 157 mg/dL — ABNORMAL HIGH (ref 70–99)

## 2014-06-25 LAB — SURGICAL PCR SCREEN
MRSA, PCR: NEGATIVE
Staphylococcus aureus: NEGATIVE

## 2014-06-25 SURGERY — HEMIARTHROPLASTY, SHOULDER
Anesthesia: General | Site: Shoulder | Laterality: Right

## 2014-06-25 MED ORDER — ROCURONIUM BROMIDE 50 MG/5ML IV SOLN
INTRAVENOUS | Status: AC
  Filled 2014-06-25: qty 1

## 2014-06-25 MED ORDER — FENTANYL CITRATE 0.05 MG/ML IJ SOLN
INTRAMUSCULAR | Status: AC
  Filled 2014-06-25: qty 5

## 2014-06-25 MED ORDER — ONDANSETRON HCL 4 MG/2ML IJ SOLN
INTRAMUSCULAR | Status: AC
  Filled 2014-06-25: qty 2

## 2014-06-25 MED ORDER — OXYCODONE HCL 5 MG/5ML PO SOLN: 5.0000 mg | Freq: Once | ORAL | Status: DC | PRN

## 2014-06-25 MED ORDER — DEXAMETHASONE SODIUM PHOSPHATE 4 MG/ML IJ SOLN
INTRAMUSCULAR | Status: DC | PRN
  Administered 2014-06-25: 8 mg via INTRAVENOUS

## 2014-06-25 MED ORDER — MIDAZOLAM HCL 5 MG/5ML IJ SOLN
INTRAMUSCULAR | Status: DC | PRN
  Administered 2014-06-25 (×2): 1 mg via INTRAVENOUS

## 2014-06-25 MED ORDER — DEXAMETHASONE SODIUM PHOSPHATE 4 MG/ML IJ SOLN
INTRAMUSCULAR | Status: AC
  Filled 2014-06-25: qty 2

## 2014-06-25 MED ORDER — PHENYLEPHRINE HCL 10 MG/ML IJ SOLN
10.0000 mg | INTRAVENOUS | Status: DC | PRN
  Administered 2014-06-25: 20 ug/min via INTRAVENOUS

## 2014-06-25 MED ORDER — DIPHENHYDRAMINE HCL 50 MG/ML IJ SOLN
INTRAMUSCULAR | Status: DC | PRN
  Administered 2014-06-25: 10 mg via INTRAVENOUS

## 2014-06-25 MED ORDER — DOCUSATE SODIUM 100 MG PO CAPS: 100.0000 mg | ORAL_CAPSULE | Freq: Two times a day (BID) | ORAL | Status: AC

## 2014-06-25 MED ORDER — ARTIFICIAL TEARS OP OINT
TOPICAL_OINTMENT | OPHTHALMIC | Status: DC | PRN
  Administered 2014-06-25: 1 via OPHTHALMIC

## 2014-06-25 MED ORDER — VANCOMYCIN HCL 1000 MG IV SOLR
1000.0000 mg | INTRAVENOUS | Status: DC | PRN
  Administered 2014-06-25: 100 mg via INTRAVENOUS

## 2014-06-25 MED ORDER — FENTANYL CITRATE 0.05 MG/ML IJ SOLN
INTRAMUSCULAR | Status: DC | PRN
  Administered 2014-06-25: 25 ug via INTRAVENOUS
  Administered 2014-06-25: 50 ug via INTRAVENOUS

## 2014-06-25 MED ORDER — DIPHENHYDRAMINE HCL 50 MG/ML IJ SOLN
INTRAMUSCULAR | Status: AC
  Filled 2014-06-25: qty 1

## 2014-06-25 MED ORDER — HYDROCODONE-ACETAMINOPHEN 5-325 MG PO TABS: 2.0000 | ORAL_TABLET | ORAL | Status: AC | PRN

## 2014-06-25 MED ORDER — MIDAZOLAM HCL 2 MG/2ML IJ SOLN: 0.5000 mg | Freq: Once | INTRAMUSCULAR | Status: DC | PRN

## 2014-06-25 MED ORDER — GLYCOPYRROLATE 0.2 MG/ML IJ SOLN
INTRAMUSCULAR | Status: DC | PRN
  Administered 2014-06-25: 0.6 mg via INTRAVENOUS

## 2014-06-25 MED ORDER — BUPIVACAINE-EPINEPHRINE (PF) 0.25% -1:200000 IJ SOLN
INTRAMUSCULAR | Status: DC | PRN
  Administered 2014-06-25: 10 mL via PERINEURAL

## 2014-06-25 MED ORDER — NEOSTIGMINE METHYLSULFATE 10 MG/10ML IV SOLN
INTRAVENOUS | Status: DC | PRN
  Administered 2014-06-25: 4 mg via INTRAVENOUS

## 2014-06-25 MED ORDER — LACTATED RINGERS IV SOLN
INTRAVENOUS | Status: DC | PRN
  Administered 2014-06-25 (×2): via INTRAVENOUS

## 2014-06-25 MED ORDER — NEOSTIGMINE METHYLSULFATE 10 MG/10ML IV SOLN
INTRAVENOUS | Status: AC
  Filled 2014-06-25: qty 1

## 2014-06-25 MED ORDER — ROCURONIUM BROMIDE 100 MG/10ML IV SOLN
INTRAVENOUS | Status: DC | PRN
  Administered 2014-06-25: 50 mg via INTRAVENOUS

## 2014-06-25 MED ORDER — PROPOFOL 10 MG/ML IV BOLUS
INTRAVENOUS | Status: DC | PRN
  Administered 2014-06-25: 20 mg via INTRAVENOUS
  Administered 2014-06-25: 50 mg via INTRAVENOUS
  Administered 2014-06-25: 150 mg via INTRAVENOUS
  Administered 2014-06-25: 20 mg via INTRAVENOUS

## 2014-06-25 MED ORDER — 0.9 % SODIUM CHLORIDE (POUR BTL) OPTIME
TOPICAL | Status: DC | PRN
Start: 2014-06-25 — End: 2014-06-25
  Administered 2014-06-25: 1000 mL

## 2014-06-25 MED ORDER — CEFAZOLIN SODIUM-DEXTROSE 2-3 GM-% IV SOLR
INTRAVENOUS | Status: DC | PRN
  Administered 2014-06-25: 2 g via INTRAVENOUS

## 2014-06-25 MED ORDER — LIDOCAINE HCL (CARDIAC) 20 MG/ML IV SOLN
INTRAVENOUS | Status: DC | PRN
  Administered 2014-06-25: 20 mg via INTRAVENOUS

## 2014-06-25 MED ORDER — PROMETHAZINE HCL 25 MG/ML IJ SOLN: 6.2500 mg | INTRAMUSCULAR | Status: DC | PRN

## 2014-06-25 MED ORDER — ONDANSETRON HCL 4 MG/2ML IJ SOLN
INTRAMUSCULAR | Status: DC | PRN
  Administered 2014-06-25: 4 mg via INTRAVENOUS

## 2014-06-25 MED ORDER — PROPOFOL 10 MG/ML IV BOLUS
INTRAVENOUS | Status: AC
  Filled 2014-06-25: qty 20

## 2014-06-25 MED ORDER — MEPERIDINE HCL 25 MG/ML IJ SOLN: 6.2500 mg | INTRAMUSCULAR | Status: DC | PRN

## 2014-06-25 MED ORDER — OXYCODONE HCL 5 MG PO TABS: 5.0000 mg | ORAL_TABLET | Freq: Once | ORAL | Status: DC | PRN

## 2014-06-25 MED ORDER — SUCCINYLCHOLINE CHLORIDE 20 MG/ML IJ SOLN
INTRAMUSCULAR | Status: AC
  Filled 2014-06-25: qty 1

## 2014-06-25 MED ORDER — LIDOCAINE HCL (CARDIAC) 20 MG/ML IV SOLN
INTRAVENOUS | Status: AC
  Filled 2014-06-25: qty 5

## 2014-06-25 MED ORDER — BUPIVACAINE-EPINEPHRINE (PF) 0.5% -1:200000 IJ SOLN
INTRAMUSCULAR | Status: DC | PRN
  Administered 2014-06-25: 30 mL via PERINEURAL

## 2014-06-25 MED ORDER — GLYCOPYRROLATE 0.2 MG/ML IJ SOLN
INTRAMUSCULAR | Status: AC
Start: 2014-06-25 — End: 2014-06-25
  Filled 2014-06-25: qty 3

## 2014-06-25 MED ORDER — MIDAZOLAM HCL 2 MG/2ML IJ SOLN
INTRAMUSCULAR | Status: AC
  Filled 2014-06-25: qty 2

## 2014-06-25 MED ORDER — FENTANYL CITRATE 0.05 MG/ML IJ SOLN: 25.0000 ug | INTRAMUSCULAR | Status: DC | PRN

## 2014-06-25 MED ORDER — BUPIVACAINE-EPINEPHRINE (PF) 0.25% -1:200000 IJ SOLN
INTRAMUSCULAR | Status: AC
  Filled 2014-06-25: qty 30

## 2014-06-25 SURGICAL SUPPLY — 60 items
BLADE SAW SAG 73X25 THK (BLADE) ×2
BLADE SAW SGTL 73X25 THK (BLADE) ×1 IMPLANT
BONE CEMENT PALACOSE (Orthopedic Implant) ×3 IMPLANT
CAPT SHLDR PARTIAL 2 ×2 IMPLANT
CEMENT BONE PALACOSE (Orthopedic Implant) IMPLANT
CLOSURE STERI-STRIP 1/2X4 (GAUZE/BANDAGES/DRESSINGS) ×1
CLOSURE WOUND 1/2 X4 (GAUZE/BANDAGES/DRESSINGS) ×1
CLSR STERI-STRIP ANTIMIC 1/2X4 (GAUZE/BANDAGES/DRESSINGS) ×1 IMPLANT
COVER SURGICAL LIGHT HANDLE (MISCELLANEOUS) ×3 IMPLANT
DRAPE IMP U-DRAPE 54X76 (DRAPES) ×3 IMPLANT
DRAPE INCISE IOBAN 66X45 STRL (DRAPES) ×5 IMPLANT
DRAPE U-SHAPE 47X51 STRL (DRAPES) ×3 IMPLANT
DRILL BIT 5/64 (BIT) ×2 IMPLANT
DRSG ADAPTIC 3X8 NADH LF (GAUZE/BANDAGES/DRESSINGS) ×3 IMPLANT
DRSG MEPILEX BORDER 4X8 (GAUZE/BANDAGES/DRESSINGS) ×2 IMPLANT
DRSG PAD ABDOMINAL 8X10 ST (GAUZE/BANDAGES/DRESSINGS) ×4 IMPLANT
DURAPREP 26ML APPLICATOR (WOUND CARE) ×3 IMPLANT
ELECT REM PT RETURN 9FT ADLT (ELECTROSURGICAL) ×3
ELECTRODE REM PT RTRN 9FT ADLT (ELECTROSURGICAL) ×1 IMPLANT
GAUZE SPONGE 4X4 12PLY STRL (GAUZE/BANDAGES/DRESSINGS) ×3 IMPLANT
GLOVE BIO SURGEON STRL SZ7 (GLOVE) ×4 IMPLANT
GLOVE BIOGEL PI IND STRL 6.5 (GLOVE) IMPLANT
GLOVE BIOGEL PI IND STRL 8 (GLOVE) ×2 IMPLANT
GLOVE BIOGEL PI INDICATOR 6.5 (GLOVE) ×4
GLOVE BIOGEL PI INDICATOR 8 (GLOVE) ×4
GLOVE BIOGEL PI ORTHO PRO SZ8 (GLOVE) ×2
GLOVE ORTHO TXT STRL SZ7.5 (GLOVE) ×7 IMPLANT
GLOVE PI ORTHO PRO STRL SZ8 (GLOVE) ×1 IMPLANT
GLOVE SURG ORTHO 8.0 STRL STRW (GLOVE) ×1 IMPLANT
GOWN STRL REUS W/ TWL LRG LVL3 (GOWN DISPOSABLE) ×1 IMPLANT
GOWN STRL REUS W/TWL 2XL LVL3 (GOWN DISPOSABLE) ×1 IMPLANT
GOWN STRL REUS W/TWL LRG LVL3 (GOWN DISPOSABLE) ×9
KIT BASIN OR (CUSTOM PROCEDURE TRAY) ×3 IMPLANT
KIT ROOM TURNOVER OR (KITS) ×3 IMPLANT
MANIFOLD NEPTUNE II (INSTRUMENTS) ×3 IMPLANT
NDL HYPO 25GX1X1/2 BEV (NEEDLE) ×1 IMPLANT
NDL SUT .5 MAYO 1.404X.05X (NEEDLE) ×1 IMPLANT
NEEDLE HYPO 25GX1X1/2 BEV (NEEDLE) ×3 IMPLANT
NEEDLE MAYO TAPER (NEEDLE) ×3
NS IRRIG 1000ML POUR BTL (IV SOLUTION) ×3 IMPLANT
PACK SHOULDER (CUSTOM PROCEDURE TRAY) ×3 IMPLANT
PACK UNIVERSAL I (CUSTOM PROCEDURE TRAY) ×3 IMPLANT
PAD ARMBOARD 7.5X6 YLW CONV (MISCELLANEOUS) ×6 IMPLANT
SLING ARM IMMOBILIZER LRG (SOFTGOODS) ×3 IMPLANT
SLING ARM IMMOBILIZER MED (SOFTGOODS) IMPLANT
SPONGE GAUZE 4X4 12PLY STER LF (GAUZE/BANDAGES/DRESSINGS) ×2 IMPLANT
SPONGE LAP 18X18 X RAY DECT (DISPOSABLE) ×3 IMPLANT
STRIP CLOSURE SKIN 1/2X4 (GAUZE/BANDAGES/DRESSINGS) ×2 IMPLANT
SUCTION FRAZIER TIP 10 FR DISP (SUCTIONS) ×3 IMPLANT
SUT FIBERWIRE #2 38 T-5 BLUE (SUTURE) ×24
SUT MNCRL AB 4-0 PS2 18 (SUTURE) ×3 IMPLANT
SUT MON AB 2-0 CT1 36 (SUTURE) ×3 IMPLANT
SUT VIC AB 0 CT1 27 (SUTURE) ×3
SUT VIC AB 0 CT1 27XBRD ANBCTR (SUTURE) ×1 IMPLANT
SUTURE FIBERWR #2 38 T-5 BLUE (SUTURE) ×4 IMPLANT
TAPE CLOTH SURG 6X10 WHT LF (GAUZE/BANDAGES/DRESSINGS) ×2 IMPLANT
TOWEL OR 17X24 6PK STRL BLUE (TOWEL DISPOSABLE) ×3 IMPLANT
TOWEL OR 17X26 10 PK STRL BLUE (TOWEL DISPOSABLE) ×3 IMPLANT
TOWER CARTRIDGE SMART MIX (DISPOSABLE) ×1 IMPLANT
WATER STERILE IRR 1000ML POUR (IV SOLUTION) ×1 IMPLANT
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
## 2014-06-25 NOTE — Transfer of Care (Signed)
Immediate Anesthesia Transfer of Care Note  Patient: Timothy Drake  Procedure(s) Performed: Procedure(s): RIGHT SHOULDER HEMI-ARTHROPLASTY (Right)  Patient Location: PACU  Anesthesia Type:GA combined with regional for post-op pain  Level of Consciousness: awake and sedated  Airway & Oxygen Therapy: Patient Spontanous Breathing and Patient connected to nasal cannula oxygen  Post-op Assessment: Report given to PACU RN, Post -op Vital signs reviewed and stable and Patient moving all extremities  Post vital signs: Reviewed and stable  Complications: No apparent anesthesia complications.  Patient's dentures taken to PACU with him and given to [facility], Therapist, sports.

## 2014-06-25 NOTE — Anesthesia Procedure Notes (Addendum)
Procedure Name: Intubation Date/Time: 06/25/2014 7:36 AM Performed by: Suzy Bouchard Pre-anesthesia Checklist: Patient identified, Emergency Drugs available, Suction available, Patient being monitored and Timeout performed Patient Re-evaluated:Patient Re-evaluated prior to inductionOxygen Delivery Method: Circle system utilized Preoxygenation: Pre-oxygenation with 100% oxygen Intubation Type: IV induction Ventilation: Mask ventilation without difficulty Laryngoscope Size: Miller and 2 Grade View: Grade I Tube type: Oral Number of attempts: 1 (one look by Morene Antu RN ([facility]).  Intubated on my first attempt Editor, commissioning)) Placement Confirmation: ETT inserted through vocal cords under direct vision,  breath sounds checked- equal and bilateral and positive ETCO2 Secured at: 22 cm Tube secured with: Tape Dental Injury: Teeth and Oropharynx as per pre-operative assessment    Anesthesia Regional Block:  Interscalene brachial plexus block  Pre-Anesthetic Checklist: ,, timeout performed, Correct Patient, Correct Site, Correct Laterality, Correct Procedure, Correct Position, site marked, Risks and benefits discussed,  Surgical consent,  Pre-op evaluation,  At surgeon's request and post-op pain management  Laterality: Right and Upper  Prep: chloraprep       Needles:  Injection technique: Single-shot  Needle Type: Echogenic Stimulator Needle     Needle Length: 4cm 4 cm Needle Gauge: 22 and 22 G    Additional Needles:  Procedures: nerve stimulator Interscalene brachial plexus block  Nerve Stimulator or Paresthesia:  Response: forearm twitch, 0.4 mA, 0.1 ms,   Additional Responses:   Narrative:  Start time: 06/25/2014 7:11 AM End time: 06/25/2014 7:20 AM Injection made incrementally with aspirations every 5 mL.  Performed by: Personally  Anesthesiologist: Seleta Rhymes Faylynn Stamos  Additional Notes: Pt identified in Holding room.  Monitors applied. Working IV access  confirmed. Sterile prep R neck.  #22ga PNS to forearm twitch at 0.38mA threshold.  30cc 0.5% Bupivacaine with 1:200k epi injected incrementally after negative test dose.  Patient asymptomatic, VSS, no heme aspirated, tolerated well.  Jenita Seashore, MD

## 2014-06-25 NOTE — Discharge Instructions (Signed)
Sling for R arm full time  Keep your incision dry and covered until follow up  PT to do passive ROM of your shoulder only

## 2014-06-25 NOTE — Anesthesia Postprocedure Evaluation (Signed)
  Anesthesia Post-op Note  Patient: Timothy Drake  Procedure(s) Performed: Procedure(s): RIGHT SHOULDER HEMI-ARTHROPLASTY (Right)  Patient Location: PACU  Anesthesia Type:GA combined with regional for post-op pain  Level of Consciousness: awake, alert , oriented and patient cooperative  Airway and Oxygen Therapy: Patient Spontanous Breathing and Patient connected to nasal cannula oxygen  Post-op Pain: none  Post-op Assessment: Post-op Vital signs reviewed, Patient's Cardiovascular Status Stable, Respiratory Function Stable, Patent Airway, No signs of Nausea or vomiting and Pain level controlled  Post-op Vital Signs: Reviewed and stable  Last Vitals:  Filed Vitals:   06/25/14 1130  BP:   Pulse: 78  Temp:   Resp: 18    Complications: No apparent anesthesia complications

## 2014-06-25 NOTE — Interval H&P Note (Signed)
History and Physical Interval Note:  06/25/2014 7:11 AM  Timothy Drake  has presented today for surgery, with the diagnosis of OTHER DISPLACED FRACTURE OF UPPER END OF RIGHT HUMERUS  The various methods of treatment have been discussed with the patient and family. After consideration of risks, benefits and other options for treatment, the patient has consented to  Procedure(s): RIGHT SHOULDER HEMI-ARTHROPLASTY (Right) as a surgical intervention .  The patient's history has been reviewed, patient examined, no change in status, stable for surgery.  I have reviewed the patient's chart and labs.  Questions were answered to the patient's satisfaction.     Milledge Gerding, D
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
## 2014-06-25 NOTE — Anesthesia Preprocedure Evaluation (Addendum)
Anesthesia Evaluation  Patient identified by MRN, date of birth, ID band Patient awake    Reviewed: Allergy & Precautions, H&P , NPO status , Patient's Chart, lab work & pertinent test results, reviewed documented beta blocker date and time   History of Anesthesia Complications Negative for: history of anesthetic complications  Airway Mallampati: I  TM Distance: >3 FB Neck ROM: Full    Dental  (+) Edentulous Upper, Dental Advisory Given   Pulmonary sleep apnea and Continuous Positive Airway Pressure Ventilation ,  breath sounds clear to auscultation        Cardiovascular hypertension, Pt. on medications - anginaRhythm:Regular Rate:Normal  06/22/14 ECHO: EF 60-65%, valves OK   Neuro/Psych Syncope x2    GI/Hepatic negative GI ROS, Neg liver ROS,   Endo/Other  diabetes (glu 147), Oral Hypoglycemic Agents  Renal/GU negative Renal ROS     Musculoskeletal   Abdominal   Peds  Hematology   Anesthesia Other Findings Prostate cancer: surgery, chemo, XRT  Reproductive/Obstetrics                            Anesthesia Physical Anesthesia Plan  ASA: III  Anesthesia Plan: General   Post-op Pain Management:    Induction: Intravenous  Airway Management Planned: Oral ETT  Additional Equipment:   Intra-op Plan:   Post-operative Plan: Extubation in OR  Informed Consent: I have reviewed the patients History and Physical, chart, labs and discussed the procedure including the risks, benefits and alternatives for the proposed anesthesia with the patient or authorized representative who has indicated his/her understanding and acceptance.   Dental advisory given  Plan Discussed with: CRNA and Surgeon  Anesthesia Plan Comments: (Plan routine monitors, GETA with interscalene block for post op analgesia)        Anesthesia Quick Evaluation

## 2014-06-26 ENCOUNTER — Encounter (HOSPITAL_COMMUNITY): Payer: Self-pay | Admitting: Orthopedic Surgery

## 2014-06-26 LAB — GLUCOSE, CAPILLARY
GLUCOSE-CAPILLARY: 212 mg/dL — AB (ref 70–99)
Glucose-Capillary: 141 mg/dL — ABNORMAL HIGH (ref 70–99)

## 2014-06-26 NOTE — Clinical Social Work Psychosocial (Signed)
Clinical Social Work Department BRIEF PSYCHOSOCIAL ASSESSMENT 06/26/2014  Patient:  Timothy Drake, Timothy Drake     Account Number:  [id number]     Admit date:  06/21/2014  Clinical Social Worker:  Wylene Men  Date/Time:  06/26/2014 12:03 PM  Referred by:  Physician  Date Referred:  06/26/2014 Referred for  SNF Placement  Psychosocial assessment   Other Referral:   none   Interview type:  Patient Other interview type:   none    PSYCHOSOCIAL DATA Living Status:  FACILITY Admitted from facility:  Other Level of care:  [place] Primary support name:  Ray Primary support relationship to patient:  SIBLING Degree of support available:   adequate; patient is from [facility], SNF    CURRENT CONCERNS Current Concerns  Post-Acute Placement   Other Concerns:   none    SOCIAL WORK ASSESSMENT / PLAN CSW assessed patient.  Patient is a part of the bundle and is from [facility] prior to admission. Patient states that he wishes to go to [facility] once medically discharged from the hospital  Patient states his main contact/support is his brother.  Patient is not refusing to return to [facility]; however he wishes to seek STR at [place].  If [facility] cannot provide bed offer, patient wishes to return to [facility].   Assessment/plan status:  Psychosocial Support/Ongoing Assessment of Needs Other assessment/ plan:   FL2  PASARR-existing   Information/referral to community resources:   SNF/STR    PATIENT'S/FAMILY'S RESPONSE TO PLAN OF CARE: Patient is agreeable to return to [facility] if no bed available at [facility].       Nonnie Done, [place] [phone]  Psychiatric & Orthopedics (5N 1-16) Clinical Social Worker
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
## 2014-06-26 NOTE — Progress Notes (Signed)
Patient ID: Timothy Drake, male   DOB: July 30, 1940, 73 y.o.   MRN: [id number]     Subjective:  Patient reports pain as mild to moderate.  Patient in bed in no acute distress.  Denies any CP or SOB  Objective:   VITALS:   Filed Vitals:   06/25/14 1130 06/25/14 1430 06/25/14 2218 06/26/14 0544  BP:  145/66 146/61 178/63  Pulse: 78 91 102 92  Temp:  98.3 F (36.8 C) 98.5 F (36.9 C) 98.1 F (36.7 C)  TempSrc:      Resp: 18 18 18 18   Height:      Weight:      SpO2: 89% 92% 94% 96%    ABD soft Sensation intact distally Dorsiflexion/Plantar flexion intact Incision: dressing C/D/I and no drainage   Lab Results  Component Value Date   WBC 8.4 06/22/2014   HGB 11.0* 06/22/2014   HCT 31.7* 06/22/2014   MCV 92.7 06/22/2014   PLT 197 06/22/2014     Assessment/Plan: 1 Day Post-Op   Principal Problem:   Syncope Active Problems:   Shoulder fracture, right   DM type 2 (diabetes mellitus, type 2)   Essential hypertension   History of prostate cancer   OSA (obstructive sleep apnea)   Seizures   Advance diet Up with therapy  Continue plan per medicine Continue sling NWB    DOUGLAS Daylin Gruszka 06/26/2014, 8:10 AM   Edmonia Lynch MD [phone]

## 2014-06-26 NOTE — Progress Notes (Signed)
CARE MANAGEMENT NOTE 06/26/2014  Patient:  Timothy Drake, Timothy Drake   Account Number:  [id number]  Date Initiated:  06/26/2014  Documentation initiated by:  [facility]  Subjective/Objective Assessment:   admitted withy rt shoulder fracture, had rt shoulder hemiarthroplasty     Action/Plan:   PT/Ot recommending SNF   Anticipated DC Date:  06/27/2014   Anticipated DC Plan:  SKILLED NURSING FACILITY  In-house referral  Clinical Social Worker      DC Planning Services  CM consult      Choice offered to / List presented to:             Status of service:  Completed, signed off Medicare Important Message given?  YES (If response is "NO", the following Medicare IM given date fields will be blank) Date Medicare IM given:  06/26/2014 Medicare IM given by:  [facility] Date Additional Medicare IM given:   Additional Medicare IM given by:    Discharge Disposition:  [place]

## 2014-06-26 NOTE — Progress Notes (Signed)
Physical Therapy Treatment Patient Details Name: Timothy Drake MRN: [id number] DOB: 07/26/1940 Today's Date: 06/26/2014    History of Present Illness 73 y.o. male with a past medical history of hypertension, type 2 diabetes, hypercholesterolemia, who has had the syncopal episodes on and off for the last 3 years. He's had about 8-9 episodes in total.  Pt adm after fall resulting in Rt humeral head fx. planned for Rt hemiarthroplasty on 06/25/14.    PT Comments    Berg Balance Assessment score of 43/56 indicates significant fall risk; Continue to recommend SNF for short toerm rehab to maximize independence and safety with mobility prior to dc home.  See vitals flow sheet for Orthostatic Vitals   Follow Up Recommendations  SNF     Equipment Recommendations  Cane    Recommendations for Other Services       Precautions / Restrictions Precautions Precautions: Shoulder;Fall Type of Shoulder Precautions: gentle PROM of shoulder (NO AROM) Shoulder Interventions: Shoulder sling/immobilizer;At all times;Off for dressing/bathing/exercises Precaution Comments: reviewed shoulder precautions with pt Required Braces or Orthoses: Sling Restrictions Weight Bearing Restrictions: Yes RUE Weight Bearing: Non weight bearing    Mobility  Bed Mobility               General bed mobility comments: Pt sitting up in recliner.   Transfers Overall transfer level: Needs assistance Equipment used: None Transfers: Sit to/from Stand Sit to Stand: Min guard         General transfer comment: Noted pt braced backs of LEs against chair for stability  Ambulation/Gait             General Gait Details: prioritized balance assessment and Orthostatic vitals   Stairs            Wheelchair Mobility    Modified Rankin (Stroke Patients Only)       Balance                                    Cognition Arousal/Alertness: Awake/alert Behavior During Therapy: WFL for  tasks assessed/performed Overall Cognitive Status: Within Functional Limits for tasks assessed                      Exercises Shoulder Exercises Shoulder Flexion: PROM;Right;10 reps;Supine Shoulder Extension: PROM;Right;10 reps;Supine Shoulder ABduction: PROM;Right;10 reps;Supine Shoulder External Rotation: PROM;Right;10 reps;Supine Elbow Flexion: PROM;Right;10 reps;Supine Elbow Extension: PROM;Right;10 reps;Supine Wrist Flexion: Right;10 reps;Supine;AROM Wrist Extension: Right;10 reps;Supine;AROM Digit Composite Flexion: Right;10 reps;Supine;AROM Composite Extension: Right;10 reps;Supine;AROM Donning/doffing shirt without moving shoulder: Maximal assistance Method for sponge bathing under operated UE: Maximal assistance Donning/doffing sling/immobilizer: Maximal assistance Correct positioning of sling/immobilizer: Maximal assistance ROM for elbow, wrist and digits of operated UE: Minimal assistance Sling wearing schedule (on at all times/off for ADL's): Supervision/safety Proper positioning of operated UE when showering: Minimal assistance Positioning of UE while sleeping: Minimal assistance    General Comments        Pertinent Vitals/Pain Pain Assessment: 0-10 Pain Score: 9  Pain Location: R shoulder  while moving forward from supported to unsupported sitting; reports subsided quickly Pain Descriptors / Indicators: Aching;Sharp Pain Intervention(s): Monitored during session;Repositioned    Home Living                      Prior Function            PT Goals (current goals can now be found in the  care plan section) Acute Rehab PT Goals Patient Stated Goal: to go to rehab then get back to independent lifestyle PT Goal Formulation: With patient Time For Goal Achievement: 06/29/14 Potential to Achieve Goals: Good Progress towards PT goals: Progressing toward goals    Frequency  Min 4X/week    PT Plan Current plan remains appropriate     Co-evaluation             End of Session Equipment Utilized During Treatment:  (sling) Activity Tolerance: Patient tolerated treatment well Patient left: in chair;with call bell/phone within reach     Time: 1145-1216 PT Time Calculation (min) (ACUTE ONLY): 31 min  Charges:  $Therapeutic Activity: 23-37 mins                    G CodesQuin Hoop 06/26/2014, 1:07 PM  Roney Marion, [place] Pager [phone] Office [phone]

## 2014-06-26 NOTE — Clinical Social Work Placement (Signed)
Clinical Social Work Department CLINICAL SOCIAL WORK PLACEMENT NOTE 06/26/2014  Patient:  Timothy Drake, Timothy Drake  Account Number:  [id number] Admit date:  06/21/2014  Clinical Social Worker:  Wylene Men  Date/time:  06/26/2014 12:10 PM  Clinical Social Work is seeking post-discharge placement for this patient at the following level of care:   SKILLED NURSING   (*CSW will update this form in Epic as items are completed)   06/25/2014  Patient/family provided with [facility] Department of Clinical Social Work's list of facilities offering this level of care within the geographic area requested by the patient (or if unable, by the patient's family).  06/25/2014  Patient/family informed of their freedom to choose among providers that offer the needed level of care, that participate in Medicare, Medicaid or managed care program needed by the patient, have an available bed and are willing to accept the patient.  06/25/2014  Patient/family informed of [facility]' ownership interest in [facility], as well as of the fact that they are under no obligation to receive care at this facility.  PASARR submitted to EDS on  PASARR number received on   FL2 transmitted to all facilities in geographic area requested by pt/family on  06/25/2014 FL2 transmitted to all facilities within larger geographic area on   Patient informed that his/her managed care company has contracts with or will negotiate with  certain facilities, including the following:     Patient/family informed of bed offers received:  06/26/2014 Patient chooses bed at [facility] [place] Physician recommends and patient chooses bed at    Patient to be transferred to [place] on  06/26/2014 Patient to be transferred to facility by [facility] Patient and family notified of transfer on 06/26/2014 Name of family member notified:  Patient and patient's brother Ray  The following  physician request were entered in Epic:   Additional Comments:   Nonnie Done, [place] [phone]  Psychiatric & Orthopedics (5N 1-16) Clinical Social Worker

## 2014-06-26 NOTE — Clinical Social Work Note (Addendum)
Patient to dc today to [facility] RN to call report prior to transportation to: [phone] Transportation: [facility]- scheduled  Packet on chart. RN aware.  Nonnie Done, [place] [phone]  Psychiatric & Orthopedics (5N 1-16) Clinical Social Worker

## 2014-06-26 NOTE — Progress Notes (Signed)
Occupational Therapy Treatment Patient Details Name: Timothy Drake MRN: [id number] DOB: Mar 19, 1941 Today's Date: 06/26/2014    History of present illness 73 y.o. male with a past medical history of hypertension, type 2 diabetes, hypercholesterolemia, who has had the syncopal episodes on and off for the last 3 years. He's had about 8-9 episodes in total.  Pt adm after fall resulting in Rt humeral head fx. planned for Rt hemiarthroplasty on 06/25/14.   OT comments  Pt seen today for therapeutic exercise and ADLs. Pt continues to be limited in ADL independence due to ROM restrictions, however tolerated gentle PROM of RUE. Pt would benefit from SNF at d/c.   Follow Up Recommendations  SNF;Supervision/Assistance - 24 hour    Equipment Recommendations  Other (comment) (TBD at SNF)    Recommendations for Other Services      Precautions / Restrictions Precautions Precautions: Shoulder;Fall Type of Shoulder Precautions: gentle PROM of shoulder (NO AROM) Shoulder Interventions: Shoulder sling/immobilizer;At all times;Off for dressing/bathing/exercises Precaution Comments: reviewed shoulder precautions with pt Required Braces or Orthoses: Sling Restrictions Weight Bearing Restrictions: Yes RUE Weight Bearing: Non weight bearing       Mobility Bed Mobility               General bed mobility comments: Pt sitting up in recliner.   Transfers                 General transfer comment: Not completed at this time.         ADL Overall ADL's : Needs assistance/impaired     Grooming: Minimal assistance;Sitting           Upper Body Dressing : Maximal assistance;Sitting                     General ADL Comments: Continued education on compensatory techniques for UE following shoulder surgery. Reviewed no AROM (gentle PROM to tolerance only). Therapeutic exercises performed.                 Cognition  Arousal/Alertness: Awake/Alert Behavior During Therapy:  WFL for tasks assessed/performed Overall Cognitive Status: Within Functional Limits for tasks assessed                         Exercises Shoulder Exercises Shoulder Flexion: PROM;Right;10 reps;Supine Shoulder Extension: PROM;Right;10 reps;Supine Shoulder ABduction: PROM;Right;10 reps;Supine Shoulder External Rotation: PROM;Right;10 reps;Supine Elbow Flexion: PROM;Right;10 reps;Supine Elbow Extension: PROM;Right;10 reps;Supine Wrist Flexion: Right;10 reps;Supine;AROM Wrist Extension: Right;10 reps;Supine;AROM Digit Composite Flexion: Right;10 reps;Supine;AROM Composite Extension: Right;10 reps;Supine;AROM Donning/doffing shirt without moving shoulder: Maximal assistance Method for sponge bathing under operated UE: Maximal assistance Donning/doffing sling/immobilizer: Maximal assistance Correct positioning of sling/immobilizer: Maximal assistance ROM for elbow, wrist and digits of operated UE: Minimal assistance Sling wearing schedule (on at all times/off for ADL's): Supervision/safety Proper positioning of operated UE when showering: Minimal assistance Positioning of UE while sleeping: Minimal assistance   Shoulder Instructions Shoulder Instructions Donning/doffing shirt without moving shoulder: Maximal assistance Method for sponge bathing under operated UE: Maximal assistance Donning/doffing sling/immobilizer: Maximal assistance Correct positioning of sling/immobilizer: Maximal assistance ROM for elbow, wrist and digits of operated UE: Minimal assistance Sling wearing schedule (on at all times/off for ADL's): Supervision/safety Proper positioning of operated UE when showering: Minimal assistance Positioning of UE while sleeping: Minimal assistance          Pertinent Vitals/ Pain       Pain Assessment: 0-10 Pain Score: 5  Pain Location: Rt shoulder  Pain Descriptors / Indicators: Aching Pain Intervention(s): Limited activity within patient's tolerance;Monitored  during session;Repositioned;RN gave pain meds during session         Frequency Min 2X/week     Progress Toward Goals  OT Goals(current goals can now be found in the care plan section)  Progress towards OT goals: Progressing toward goals     Plan Discharge plan remains appropriate       End of Session Equipment Utilized During Treatment: Other (comment) (sling)   Activity Tolerance Patient tolerated treatment well   Patient Left in chair;with call bell/phone within reach   Nurse Communication          Time: 7544-9201 OT Time Calculation (min): 24 min  Charges: OT General Charges $OT Visit: 1 Procedure OT Treatments $Self Care/Home Management : 8-22 mins $Therapeutic Exercise: 8-22 mins  Villa Herb M 06/26/2014, 10:45 AM   Cyndie Chime, OTR/L Occupational Therapist [phone] (pager)

## 2015-04-28 DIAGNOSIS — C159 Malignant neoplasm of esophagus, unspecified: Secondary | ICD-10-CM | POA: Diagnosis not present

## 2015-04-28 DIAGNOSIS — C787 Secondary malignant neoplasm of liver and intrahepatic bile duct: Secondary | ICD-10-CM | POA: Diagnosis not present

## 2015-05-12 DIAGNOSIS — C787 Secondary malignant neoplasm of liver and intrahepatic bile duct: Secondary | ICD-10-CM | POA: Diagnosis not present

## 2015-05-12 DIAGNOSIS — C159 Malignant neoplasm of esophagus, unspecified: Secondary | ICD-10-CM | POA: Diagnosis not present

## 2015-05-26 DIAGNOSIS — C787 Secondary malignant neoplasm of liver and intrahepatic bile duct: Secondary | ICD-10-CM

## 2015-05-26 DIAGNOSIS — C159 Malignant neoplasm of esophagus, unspecified: Secondary | ICD-10-CM

## 2015-06-09 DIAGNOSIS — C159 Malignant neoplasm of esophagus, unspecified: Secondary | ICD-10-CM

## 2015-06-09 DIAGNOSIS — C787 Secondary malignant neoplasm of liver and intrahepatic bile duct: Secondary | ICD-10-CM

## 2015-06-23 DIAGNOSIS — C159 Malignant neoplasm of esophagus, unspecified: Secondary | ICD-10-CM | POA: Diagnosis not present

## 2015-06-23 DIAGNOSIS — R1310 Dysphagia, unspecified: Secondary | ICD-10-CM | POA: Diagnosis not present

## 2015-07-07 DIAGNOSIS — C159 Malignant neoplasm of esophagus, unspecified: Secondary | ICD-10-CM | POA: Diagnosis not present

## 2015-07-07 DIAGNOSIS — C787 Secondary malignant neoplasm of liver and intrahepatic bile duct: Secondary | ICD-10-CM | POA: Diagnosis not present

## 2015-08-18 DIAGNOSIS — C159 Malignant neoplasm of esophagus, unspecified: Secondary | ICD-10-CM | POA: Diagnosis not present

## 2015-08-18 DIAGNOSIS — C787 Secondary malignant neoplasm of liver and intrahepatic bile duct: Secondary | ICD-10-CM | POA: Diagnosis not present

## 2015-09-01 DIAGNOSIS — C787 Secondary malignant neoplasm of liver and intrahepatic bile duct: Secondary | ICD-10-CM | POA: Diagnosis not present

## 2015-09-01 DIAGNOSIS — C159 Malignant neoplasm of esophagus, unspecified: Secondary | ICD-10-CM | POA: Diagnosis not present

## 2015-09-15 DIAGNOSIS — C787 Secondary malignant neoplasm of liver and intrahepatic bile duct: Secondary | ICD-10-CM | POA: Diagnosis not present

## 2015-09-15 DIAGNOSIS — C159 Malignant neoplasm of esophagus, unspecified: Secondary | ICD-10-CM | POA: Diagnosis not present

## 2015-09-29 DIAGNOSIS — G629 Polyneuropathy, unspecified: Secondary | ICD-10-CM | POA: Diagnosis not present

## 2015-09-29 DIAGNOSIS — C787 Secondary malignant neoplasm of liver and intrahepatic bile duct: Secondary | ICD-10-CM | POA: Diagnosis not present

## 2015-09-29 DIAGNOSIS — C159 Malignant neoplasm of esophagus, unspecified: Secondary | ICD-10-CM | POA: Diagnosis not present

## 2015-10-27 DIAGNOSIS — G622 Polyneuropathy due to other toxic agents: Secondary | ICD-10-CM | POA: Diagnosis not present

## 2015-10-27 DIAGNOSIS — C159 Malignant neoplasm of esophagus, unspecified: Secondary | ICD-10-CM | POA: Diagnosis not present

## 2015-10-27 DIAGNOSIS — C787 Secondary malignant neoplasm of liver and intrahepatic bile duct: Secondary | ICD-10-CM | POA: Diagnosis not present

## 2015-10-27 DIAGNOSIS — F329 Major depressive disorder, single episode, unspecified: Secondary | ICD-10-CM

## 2015-11-17 DIAGNOSIS — C159 Malignant neoplasm of esophagus, unspecified: Secondary | ICD-10-CM | POA: Diagnosis not present

## 2015-11-17 DIAGNOSIS — C787 Secondary malignant neoplasm of liver and intrahepatic bile duct: Secondary | ICD-10-CM | POA: Diagnosis not present

## 2015-12-15 DIAGNOSIS — K922 Gastrointestinal hemorrhage, unspecified: Secondary | ICD-10-CM

## 2015-12-15 DIAGNOSIS — C787 Secondary malignant neoplasm of liver and intrahepatic bile duct: Secondary | ICD-10-CM | POA: Diagnosis not present

## 2015-12-15 DIAGNOSIS — R1310 Dysphagia, unspecified: Secondary | ICD-10-CM

## 2015-12-15 DIAGNOSIS — C159 Malignant neoplasm of esophagus, unspecified: Secondary | ICD-10-CM | POA: Diagnosis not present

## 2015-12-29 DIAGNOSIS — C159 Malignant neoplasm of esophagus, unspecified: Secondary | ICD-10-CM | POA: Diagnosis not present

## 2015-12-29 DIAGNOSIS — C787 Secondary malignant neoplasm of liver and intrahepatic bile duct: Secondary | ICD-10-CM | POA: Diagnosis not present

## 2016-01-11 ENCOUNTER — Emergency Department (HOSPITAL_COMMUNITY): Payer: Medicare Other

## 2016-01-11 ENCOUNTER — Inpatient Hospital Stay (HOSPITAL_COMMUNITY)
Admission: EM | Admit: 2016-01-11 | Discharge: 2016-01-17 | DRG: 065 | Disposition: E | Payer: Medicare Other | Attending: Emergency Medicine | Admitting: Emergency Medicine

## 2016-01-11 DIAGNOSIS — Z888 Allergy status to other drugs, medicaments and biological substances status: Secondary | ICD-10-CM

## 2016-01-11 DIAGNOSIS — I619 Nontraumatic intracerebral hemorrhage, unspecified: Secondary | ICD-10-CM | POA: Diagnosis present

## 2016-01-11 DIAGNOSIS — E119 Type 2 diabetes mellitus without complications: Secondary | ICD-10-CM | POA: Diagnosis present

## 2016-01-11 DIAGNOSIS — I616 Nontraumatic intracerebral hemorrhage, multiple localized: Secondary | ICD-10-CM

## 2016-01-11 DIAGNOSIS — Z880 Allergy status to penicillin: Secondary | ICD-10-CM | POA: Diagnosis not present

## 2016-01-11 DIAGNOSIS — G4730 Sleep apnea, unspecified: Secondary | ICD-10-CM | POA: Diagnosis present

## 2016-01-11 DIAGNOSIS — I629 Nontraumatic intracranial hemorrhage, unspecified: Secondary | ICD-10-CM

## 2016-01-11 DIAGNOSIS — I639 Cerebral infarction, unspecified: Secondary | ICD-10-CM

## 2016-01-11 DIAGNOSIS — C7931 Secondary malignant neoplasm of brain: Secondary | ICD-10-CM | POA: Diagnosis present

## 2016-01-11 DIAGNOSIS — Z8249 Family history of ischemic heart disease and other diseases of the circulatory system: Secondary | ICD-10-CM

## 2016-01-11 DIAGNOSIS — Z931 Gastrostomy status: Secondary | ICD-10-CM

## 2016-01-11 DIAGNOSIS — Z515 Encounter for palliative care: Secondary | ICD-10-CM | POA: Diagnosis present

## 2016-01-11 DIAGNOSIS — C799 Secondary malignant neoplasm of unspecified site: Secondary | ICD-10-CM

## 2016-01-11 DIAGNOSIS — Z8501 Personal history of malignant neoplasm of esophagus: Secondary | ICD-10-CM | POA: Diagnosis not present

## 2016-01-11 DIAGNOSIS — Z66 Do not resuscitate: Secondary | ICD-10-CM | POA: Diagnosis present

## 2016-01-11 DIAGNOSIS — Z96611 Presence of right artificial shoulder joint: Secondary | ICD-10-CM | POA: Diagnosis present

## 2016-01-11 DIAGNOSIS — I10 Essential (primary) hypertension: Secondary | ICD-10-CM | POA: Diagnosis present

## 2016-01-11 LAB — CBC
HCT: 33.5 % — ABNORMAL LOW (ref 39.0–52.0)
Hemoglobin: 10.7 g/dL — ABNORMAL LOW (ref 13.0–17.0)
MCH: 27.7 pg (ref 26.0–34.0)
MCHC: 31.9 g/dL (ref 30.0–36.0)
MCV: 86.8 fL (ref 78.0–100.0)
PLATELETS: 160 10*3/uL (ref 150–400)
RBC: 3.86 MIL/uL — ABNORMAL LOW (ref 4.22–5.81)
RDW: 14.8 % (ref 11.5–15.5)
WBC: 17.6 10*3/uL — ABNORMAL HIGH (ref 4.0–10.5)

## 2016-01-11 LAB — I-STAT TROPONIN, ED: Troponin i, poc: 0.07 ng/mL (ref 0.00–0.08)

## 2016-01-11 LAB — I-STAT CHEM 8, ED
BUN: 24 mg/dL — ABNORMAL HIGH (ref 6–20)
Calcium, Ion: 1.06 mmol/L — ABNORMAL LOW (ref 1.13–1.30)
Chloride: 98 mmol/L — ABNORMAL LOW (ref 101–111)
Creatinine, Ser: 0.8 mg/dL (ref 0.61–1.24)
Glucose, Bld: 287 mg/dL — ABNORMAL HIGH (ref 65–99)
HEMATOCRIT: 33 % — AB (ref 39.0–52.0)
Hemoglobin: 11.2 g/dL — ABNORMAL LOW (ref 13.0–17.0)
Potassium: 4.3 mmol/L (ref 3.5–5.1)
SODIUM: 133 mmol/L — AB (ref 135–145)
TCO2: 23 mmol/L (ref 0–100)

## 2016-01-11 LAB — COMPREHENSIVE METABOLIC PANEL
ALK PHOS: 123 U/L (ref 38–126)
ALT: 62 U/L (ref 17–63)
ANION GAP: 10 (ref 5–15)
AST: 61 U/L — ABNORMAL HIGH (ref 15–41)
Albumin: 3.3 g/dL — ABNORMAL LOW (ref 3.5–5.0)
BUN: 23 mg/dL — ABNORMAL HIGH (ref 6–20)
CALCIUM: 8.9 mg/dL (ref 8.9–10.3)
CHLORIDE: 100 mmol/L — AB (ref 101–111)
CO2: 22 mmol/L (ref 22–32)
Creatinine, Ser: 0.89 mg/dL (ref 0.61–1.24)
GFR calc non Af Amer: >60 mL/min (ref >60)
Glucose, Bld: 286 mg/dL — ABNORMAL HIGH (ref 65–99)
Potassium: 4.3 mmol/L (ref 3.5–5.1)
SODIUM: 132 mmol/L — AB (ref 135–145)
Total Bilirubin: 0.6 mg/dL (ref 0.3–1.2)
Total Protein: 6 g/dL — ABNORMAL LOW (ref 6.5–8.1)

## 2016-01-11 LAB — DIFFERENTIAL
BASOS PCT: 0 %
Basophils Absolute: 0 10*3/uL (ref 0.0–0.1)
EOS ABS: 0.1 10*3/uL (ref 0.0–0.7)
Eosinophils Relative: 1 %
Lymphocytes Relative: 8 %
Lymphs Abs: 1.5 10*3/uL (ref 0.7–4.0)
MONO ABS: 0.5 10*3/uL (ref 0.1–1.0)
Monocytes Relative: 3 %
Neutro Abs: 15.6 10*3/uL — ABNORMAL HIGH (ref 1.7–7.7)
Neutrophils Relative %: 88 %

## 2016-01-11 LAB — PROTIME-INR
INR: 1.01 (ref 0.00–1.49)
PROTHROMBIN TIME: 13.6 s (ref 11.6–15.2)

## 2016-01-11 LAB — APTT: aPTT: 23 seconds — ABNORMAL LOW (ref 24–37)

## 2016-01-11 LAB — ETHANOL

## 2016-01-11 MED ORDER — ONDANSETRON HCL 4 MG/2ML IJ SOLN: 4.0000 mg | Freq: Three times a day (TID) | INTRAMUSCULAR | Status: AC | PRN

## 2016-01-11 MED ORDER — LABETALOL HCL 5 MG/ML IV SOLN: 10.0000 mg | INTRAVENOUS | Status: DC | PRN

## 2016-01-11 NOTE — ED Notes (Signed)
Family requests [facility] in [place], [place] for services

## 2016-01-11 NOTE — ED Notes (Signed)
The pt has been pronounced dead at 31 with family at the bedside

## 2016-01-11 NOTE — ED Notes (Signed)
Nasal trumpet placed right nares --

## 2016-01-11 NOTE — Progress Notes (Signed)
Spoke with HCPOA, patient is full comfort care as of this time. DNR/DNI order placed.  Roland Rack, MD [facility] [phone]  If 7pm- 7am, please page neurology on call as listed in [place].

## 2016-01-11 NOTE — Progress Notes (Signed)
[place] responded to a Code Stroke in trauma C. 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 (Per EDP) I escorted the patients friend (who has [place] power of attorney)  and a friend who was a priest where the EPD informed them of the prognosis. The decision was to follow the patients AD which stated DNR. I provided the ministry of hospitality, presence, and prayer. The patient expired at or around 5:00 PM  Graciela Husbands 6:11 PM    01/07/2016 1700  Clinical Encounter Type  Visited With Family;Patient not available;Health care provider  Visit Type Initial;Spiritual support;Code;Death;ED;Patient actively dying;Trauma  Referral From Nurse;Patient's clergy  Spiritual Encounters  Spiritual Needs Prayer;Emotional;Grief support  Stress Factors  Patient Stress Factors Health changes  Family Stress Factors Health changes;Loss;Major life changes  Advance Directives (For Healthcare)  Does patient have an advance directive? Yes  Type of Paramedic of [place];Living will  Copy of advanced directive(s) in chart? Yes

## 2016-01-11 NOTE — ED Notes (Signed)
During transport from CT, pt vomited large amount liquid and partial digested food. Pt was clamping down when attempting to suction, resp unlabored, dr Ashok Cordia and dr Leonel Ramsay at bedside.  G-tube was attached to low suction, stomach contents suctioned.

## 2016-01-11 NOTE — ED Notes (Signed)
[place] family at the bedside  Along with a priest

## 2016-01-11 NOTE — ED Provider Notes (Addendum)
CSN: [id number]     Arrival date & time 01/09/2016  1419 History   First MD Initiated Contact with Patient 01/16/2016 1420     Chief Complaint  Patient presents with  . Code Stroke     (Consider location/radiation/quality/duration/timing/severity/associated sxs/prior Treatment) The history is provided by the patient and the EMS personnel. The history is limited by the condition of the patient.  Patient noted by family to suddenly become unresponsive this afternoon. EMS called and transported as code stroke.  History esophageal cancer. Has g tube.  Patient unresponsive on arrival to ED - level 5 caveat.       Past Medical History  Diagnosis Date  . Wears glasses   . Wears dentures     top  . Sleep apnea     uses a cpap  . Hypertension   . Diabetes mellitus without complication   . Arthritis    Past Surgical History  Procedure Laterality Date  . Prostate surgery    . Shoulder hemi-arthroplasty Right 06/25/2014    Procedure: RIGHT SHOULDER HEMI-ARTHROPLASTY;  Surgeon: Renette Butters, MD;  Location: [place];  Service: Orthopedics;  Laterality: Right;   Family History  Problem Relation Age of Onset  . Heart attack Mother   . Heart attack Father   . Heart attack Brother    Social History  Substance Use Topics  . Smoking status: Never Smoker   . Smokeless tobacco: Not on file  . Alcohol Use: Yes     Comment: rare glass of wine       Review of Systems  Unable to perform ROS: Patient unresponsive  patient unresponsive - level 5 caveat.       Allergies  Valium and Penicillins  Home Medications   Prior to Admission medications   Medication Sig Start Date End Date Taking? Authorizing Provider  amLODipine (NORVASC) 5 MG tablet Take 5 mg by mouth daily. 03/26/14   Historical Provider, MD  BENICAR 40 MG tablet Take 40 mg by mouth at bedtime. 05/27/14   Historical Provider, MD  docusate sodium (COLACE) 100 MG capsule Take 1 capsule (100 mg total) by mouth 2 (two) times  daily. 06/25/14   Renette Butters, MD  glimepiride (AMARYL) 1 MG tablet Take 1 mg by mouth every morning. 04/24/14   Historical Provider, MD  HYDROcodone-acetaminophen (NORCO) 5-325 MG per tablet Take 2 tablets by mouth every 4 (four) hours as needed. 06/25/14   Renette Butters, MD  metFORMIN (GLUCOPHAGE-XR) 500 MG 24 hr tablet Take 500 mg by mouth 2 (two) times daily. 05/23/14   Historical Provider, MD  pravastatin (PRAVACHOL) 80 MG tablet Take 80 mg by mouth at bedtime. 05/22/14   Historical Provider, MD  Venlafaxine HCl 150 MG TB24 Take 150 mg by mouth every morning. 05/21/14   Historical Provider, MD  zolpidem (AMBIEN CR) 12.5 MG CR tablet Take 12.5 mg by mouth at bedtime. 06/07/14   Historical Provider, MD   There were no vitals taken for this visit. Physical Exam  Constitutional: He appears well-developed and well-nourished. No distress.  HENT:  Head: Atraumatic.  Mouth/Throat: Oropharynx is clear and moist.  Eyes: Conjunctivae are normal. No scleral icterus.  Right pupil irregular ?prior surgery.   Neck: Neck supple. No tracheal deviation present.  Cardiovascular: Normal rate, regular rhythm, normal heart sounds and intact distal pulses.   Pulmonary/Chest: Effort normal and breath sounds normal. No accessory muscle usage. No respiratory distress.  Abdominal: Soft. He exhibits no distension. There is  no tenderness.  g tube site without sign of infection  Musculoskeletal: He exhibits no edema.  Neurological:  Breathing on own. Eyes closed, does not open to command. No verbal response. No movement/withdrawl on right.   Skin: Skin is warm and dry. He is not diaphoretic.  Psychiatric:  Unresponsive.    Nursing note and vitals reviewed.   ED Course  Procedures (including critical care time) Labs Review   Results for orders placed or performed during the hospital encounter of 01/03/2016  Protime-INR  Result Value Ref Range   Prothrombin Time 13.6 11.6 - 15.2 seconds   INR 1.01 0.00  - 1.49  APTT  Result Value Ref Range   aPTT 23 (L) 24 - 37 seconds  CBC  Result Value Ref Range   WBC 17.6 (H) 4.0 - 10.5 K/uL   RBC 3.86 (L) 4.22 - 5.81 MIL/uL   Hemoglobin 10.7 (L) 13.0 - 17.0 g/dL   HCT 33.5 (L) 39.0 - 52.0 %   MCV 86.8 78.0 - 100.0 fL   MCH 27.7 26.0 - 34.0 pg   MCHC 31.9 30.0 - 36.0 g/dL   RDW 14.8 11.5 - 15.5 %   Platelets 160 150 - 400 K/uL  Differential  Result Value Ref Range   Neutrophils Relative % 88 %   Neutro Abs 15.6 (H) 1.7 - 7.7 K/uL   Lymphocytes Relative 8 %   Lymphs Abs 1.5 0.7 - 4.0 K/uL   Monocytes Relative 3 %   Monocytes Absolute 0.5 0.1 - 1.0 K/uL   Eosinophils Relative 1 %   Eosinophils Absolute 0.1 0.0 - 0.7 K/uL   Basophils Relative 0 %   Basophils Absolute 0.0 0.0 - 0.1 K/uL  I-Stat Chem 8, ED  (not at [facility], [facility])  Result Value Ref Range   Sodium 133 (L) 135 - 145 mmol/L   Potassium 4.3 3.5 - 5.1 mmol/L   Chloride 98 (L) 101 - 111 mmol/L   BUN 24 (H) 6 - 20 mg/dL   Creatinine, Ser 0.80 0.61 - 1.24 mg/dL   Glucose, Bld 287 (H) 65 - 99 mg/dL   Calcium, Ion 1.06 (L) 1.13 - 1.30 mmol/L   TCO2 23 0 - 100 mmol/L   Hemoglobin 11.2 (L) 13.0 - 17.0 g/dL   HCT 33.0 (L) 39.0 - 52.0 %  I-stat troponin, ED (not at [facility], [facility])  Result Value Ref Range   Troponin i, poc 0.07 0.00 - 0.08 ng/mL   Comment 3              I have personally reviewed and evaluated these images and lab results as part of my medical decision-making.    MDM   Iv ns. Continuous pulse ox and monitor.    Patient was a code stroke activation prior to arrival.  Neurology consulted and evaluated in ED.   Stat head ct with large ICH, mets, hem into met.   Patient with emesis on return to ED.  resp therapy consulted, aggressively suctioned. G tube to suction/gastric contents drained.   Neurology planning on admission.  Will have discussions w family on arrival, re condition, prognosis, and discuss comfort/supportive/palliative care.   Dr Leonel Ramsay had  discussions with patients POA, and indicates is comfort care/DNR, and to admit to medical service.   Unassigned medicine paged for admission.      Lajean Saver, MD 01/10/2016 [phone]

## 2016-01-11 NOTE — Consult Note (Signed)
Neurology Consultation Reason for Consult: Unresponsiveness Referring Physician: Rosine Abe  CC: unresponsiveness  History is obtained from:EMS  HPI: Timothy Drake is a 75 y.o. male who was last in his normal state at 1 pm. He was then found unresponsive with right arm weakness and EMS was called.   Of note, he has a history of esophageal cnacer.   After arrival, there is delay before family arrived the patient appeared to be protecting his airway. After CT, there was a large volume of emesis but he still was able to clear his airway on his own and based on the findings of the CT and the fact that he was still protecting his airway we decided to hold off on intubation pending family arrival. He was severely hypertensive, and I initiated treatment with labetalol. Once they arrived, his living will clearly stated that in the situation where he had a significant impairment in his ability to think he would not want to be supported with machines.  LKW: 1pm tpa given?: no, ICH    ROS: Unable to obtain due to altered mental status.   Past Medical History  Diagnosis Date  . Wears glasses   . Wears dentures     top  . Sleep apnea     uses a cpap  . Hypertension   . Diabetes mellitus without complication   . Arthritis      Family History  Problem Relation Age of Onset  . Heart attack Mother   . Heart attack Father   . Heart attack Brother      Social History:  reports that he has never smoked. He does not have any smokeless tobacco history on file. He reports that he drinks alcohol. He reports that he does not use illicit drugs.   Exam: Current vital signs: There were no vitals taken for this visit. Vital signs in last 24 hours:     Physical Exam  Constitutional: Appears In distress Psych: Does not respond Eyes: No scleral injection HENT: No OP obstrucion Head: Normocephalic.  Cardiovascular: Normal rate  Respiratory: He has some upper airway sounds GI: PEG tube in  place.  Skin: WDI  Neuro: Mental Status: Patient is obtunded Cranial Nerves: II: Does not blink to threat. Right pupil is postsurgical left pupil is reactive III,IV, VI: Right gaze preference V: Grimaces bilaterally VII: With right droop  VIII, X, XI, XII: Unable to assess secondary to patient's altered mental status.  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 Given the size of the hemorrhage, fact that he has multiple metastatic hemorrhagic lesions, I feel that he is unlikely to survive this hemorrhage. Coupled with this the fact that he has been even before this "too frail" to be able to take chemotherapy, and given this I recommended to the healthcare power of attorney that she pursue comfort care and she agreed with this course.  Recommendations: 1) Comfort Care only 2) please call if neurology can be of any further assistance in the care of this patient.  This patient is critically ill and at significant risk of neurological worsening, death and care requires constant monitoring of vital signs, hemodynamics,respiratory and cardiac monitoring, neurological assessment, discussion with family, other specialists and medical decision making of  high complexity. I spent 60 minutes of neurocritical care time  in the care of  this patient.  Roland Rack, MD [facility] [phone]  If 7pm- 7am, please page neurology on call as listed in [place]. 12/19/2015  4:21 PM

## 2016-01-11 NOTE — Progress Notes (Signed)
Patient deceased prior to evaluation for admission. Spoke with ED attending, Dr. Sabra Heck, who acknowledged ED will take care of further arrangements. Appreciate assistance.  Zada Finders, MD Internal Medicine PGY-1

## 2016-01-11 NOTE — ED Notes (Signed)
Dr Leonel Ramsay speaking with family in conference room. Pt's POA and priest are present.

## 2016-01-11 NOTE — ED Notes (Signed)
[place] DONOR SERVICE NOTIFIED [id number]  NO  DONATIONS ARE NOT TO BE MADE  REASON META TO HIS BRAIN  DONOR NUMBER [phone] Timothy Drake

## 2016-01-11 NOTE — ED Notes (Signed)
Report to rn on 44m

## 2016-01-11 NOTE — ED Notes (Signed)
To ED from [facility]

## 2016-01-17 DEATH — deceased

## 2016-10-13 IMAGING — CT CT HEAD CODE STROKE
3 series · 15 of 47 positions shown, 18 images · non-contrast
Comparison: 06/20/2014

CLINICAL DATA: Code stroke, altered mental status, onset of
symptoms 8 hours ago, history essential hypertension, type II
diabetes mellitus ; past history of esophageal cancer and prostate
cancer

EXAM:
CT HEAD WITHOUT CONTRAST
TECHNIQUE: Contiguous axial images were obtained from the base of the skull
through the vertex without intravenous contrast. Sagittal and
coronal MPR images reconstructed from axial data set.

[Series 2: head 5.0 st · axial · 0.43mm/px · z∈[-180,-20]mm · 9 of 38 slices shown, 12 images]
[im 3/38  brain]
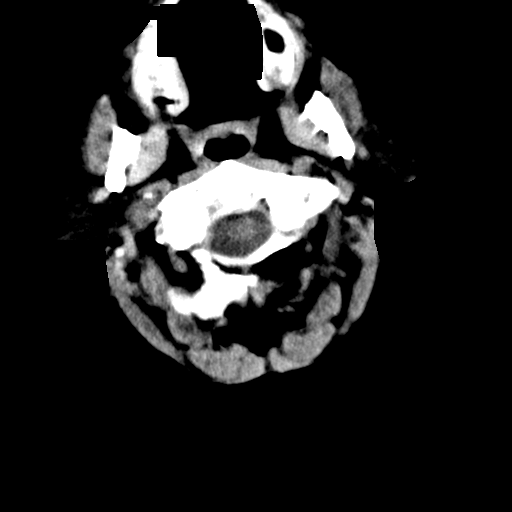
[im 3/38  bone]
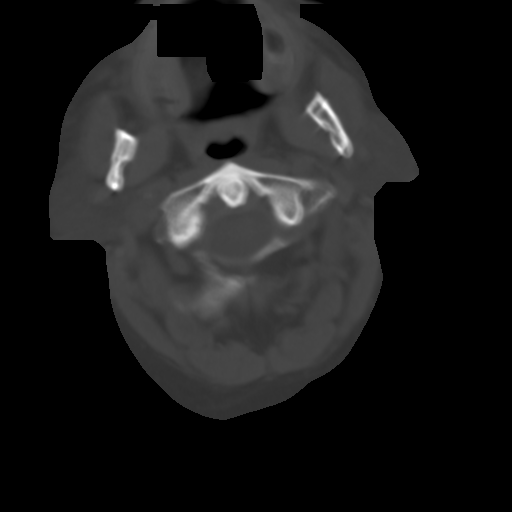
[im 7/38  brain]
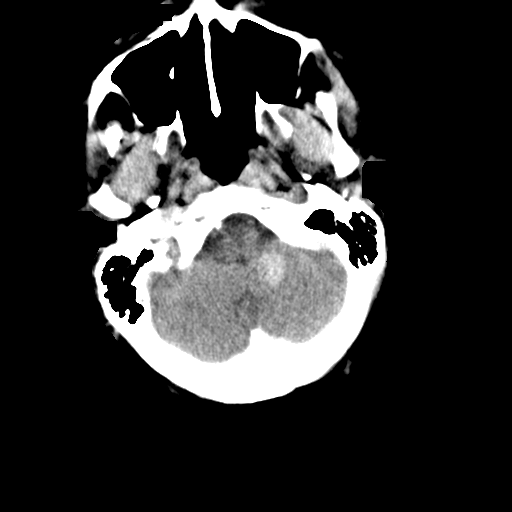
[im 11/38  brain]
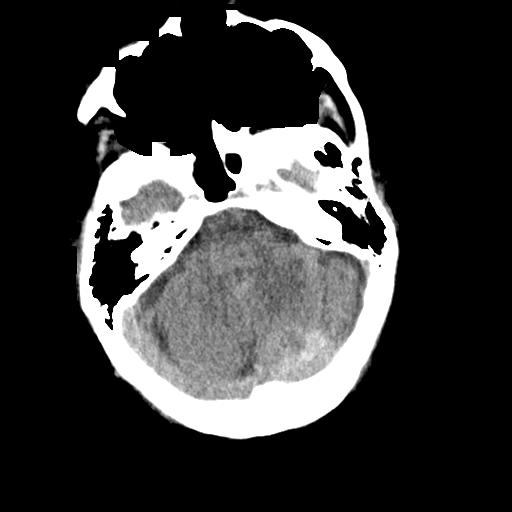
[im 15/38  brain]
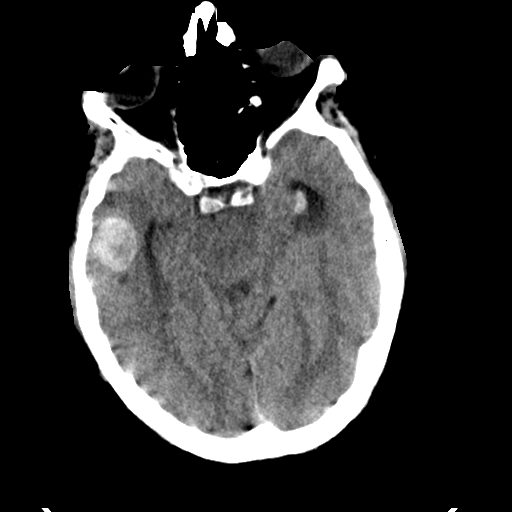
[im 20/38  brain]
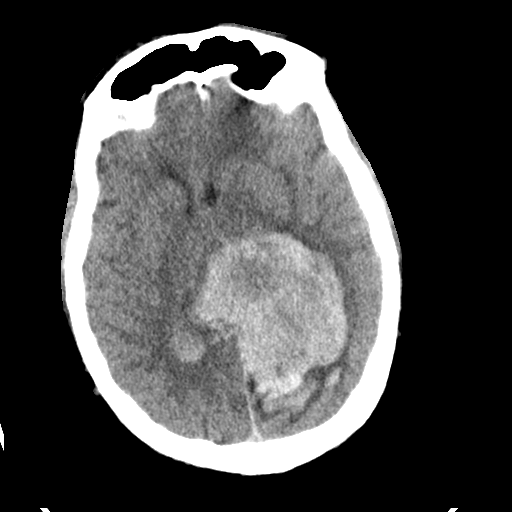
[im 20/38  bone]
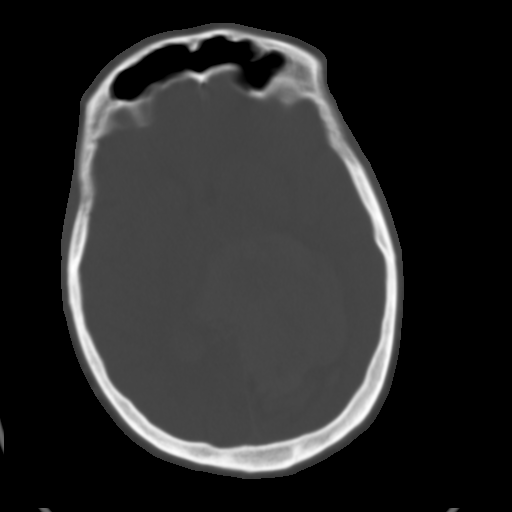
[im 23/38  brain]
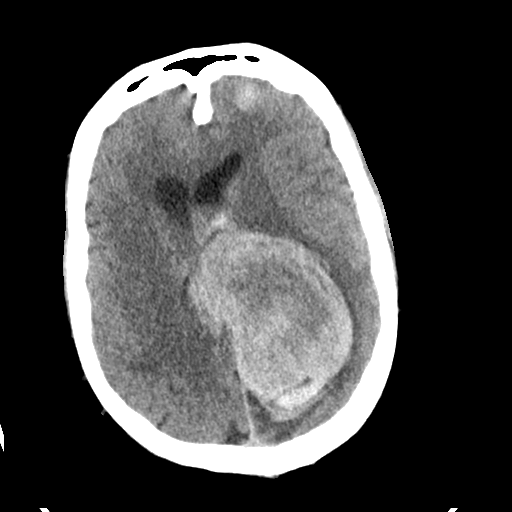
[im 27/38  brain]
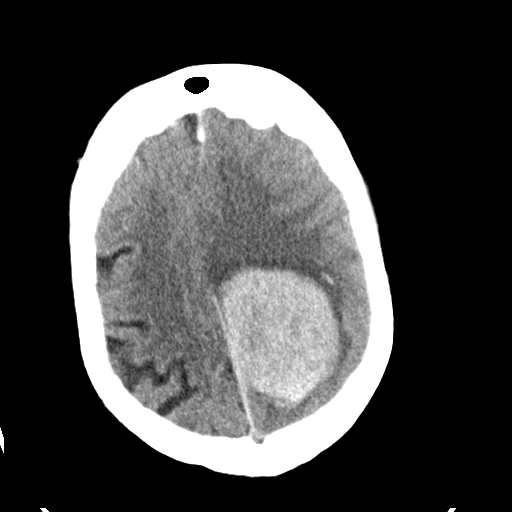
[im 31/38  brain]
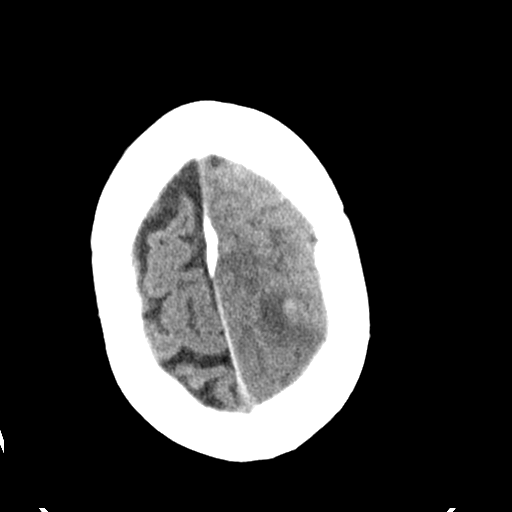
[im 35/38  brain]
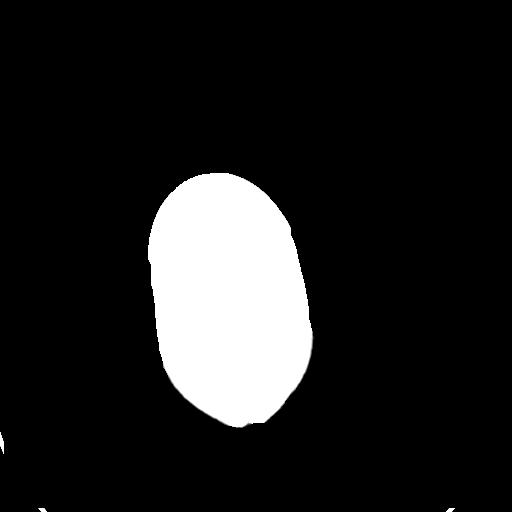
[im 35/38  bone]
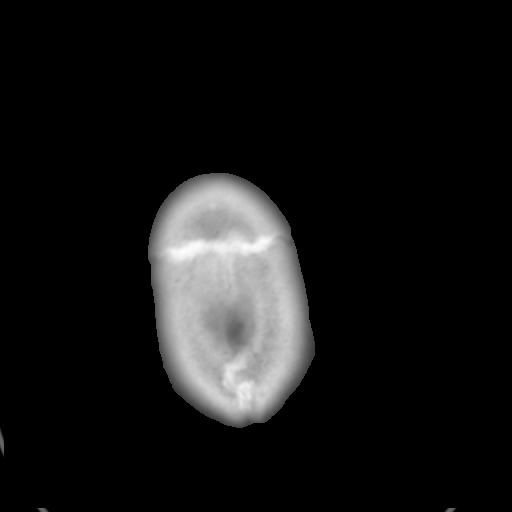

[Series 4: head 3.0 cor st · coronal · 0.33mm/px · 3 of 71 slices shown]
[im 24/71  brain]
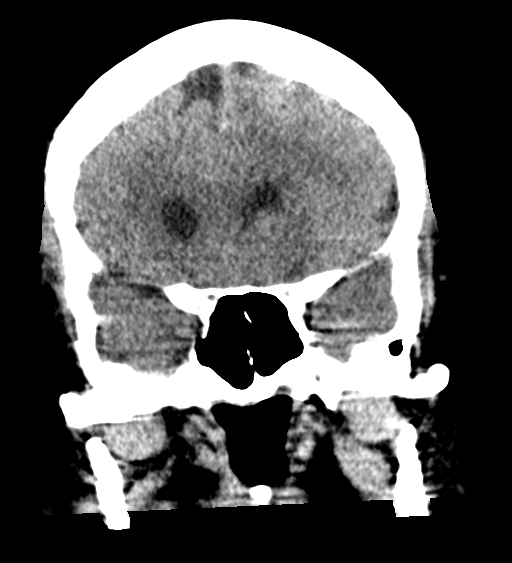
[im 32/71  brain]
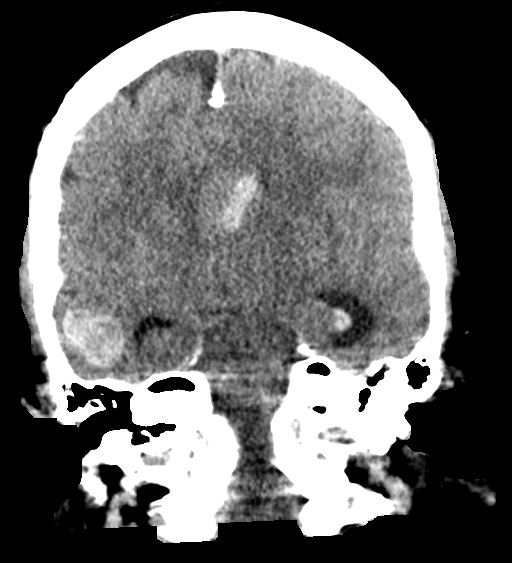
[im 39/71  brain]
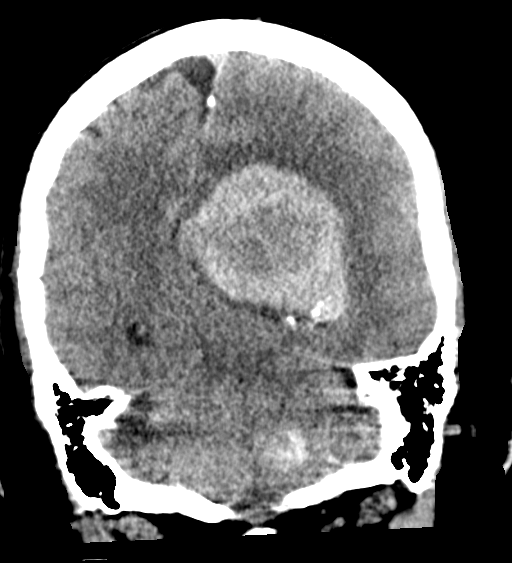

[Series 5: head 3.0 sag st · sagittal · 0.39mm/px · 3 of 67 slices shown]
[im 25/67  brain]
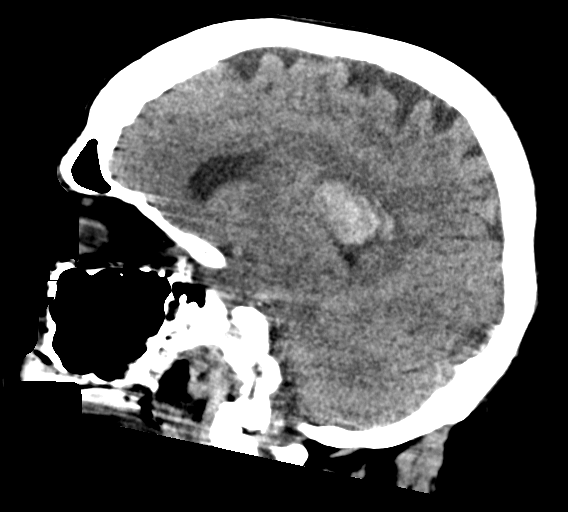
[im 34/67  brain]
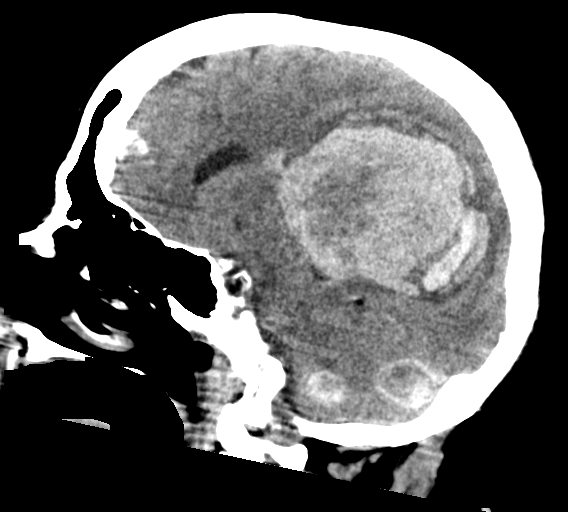
[im 42/67  brain]
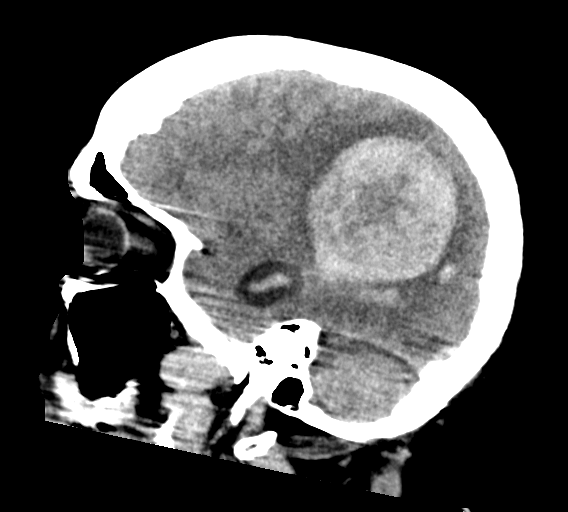

[15 of 47 positions shown; findings below may reference images not displayed]

FINDINGS: Large focus of intra cerebral hemorrhage at posterior LEFT parietal
lobe 8.5 x 6.2 x 6.4 cm image 22.

Associated surrounding vasogenic edema and intraventricular
extension of hemorrhage.

11 mm of LEFT-to-RIGHT midline shift.

Additionally, multiple high attenuation masses are seen within brain
at LEFT frontal lobe, RIGHT temporal lobe, and LEFT cerebellum
compatible with hemorrhagic metastases.

Partial effacement of peripontine cisterns versus prior study.

Diffuse edema within LEFT hemisphere with sulcal effacement.

No extra-axial fluid collection or additional evidence of
infarction.

Scattered motion artifacts.

No acute bone or sinus abnormalities.
IMPRESSION: Hyperdense mass lesions within brain parenchyma in RIGHT temporal
lobe, LEFT frontal lobe, and LEFT cerebellum compatible with
hemorrhagic metastases.

Large intra cerebral hematoma posterior LEFT parietal lobe 8.5 x
x 6.4 cm associated with significant vasogenic edema and 11 mm of
LEFT-to-RIGHT midline shift as well as partial effacement of basilar
cisterns.

Dr. Prikylin aware of the above findings, discussed with Dr.
Balraj at time of imaging.
